# Patient Record
Sex: Male | Born: 1950 | Race: Black or African American | Hispanic: No | Marital: Single | Smoking: Never smoker
Health system: Southern US, Community
[De-identification: ages and names within clinical notes are randomized; demographics above are authoritative.]

## PROBLEM LIST (undated history)

## (undated) DIAGNOSIS — I10 Essential (primary) hypertension: Secondary | ICD-10-CM

## (undated) DIAGNOSIS — I454 Nonspecific intraventricular block: Secondary | ICD-10-CM

## (undated) DIAGNOSIS — C61 Malignant neoplasm of prostate: Secondary | ICD-10-CM

## (undated) DIAGNOSIS — R29898 Other symptoms and signs involving the musculoskeletal system: Secondary | ICD-10-CM

## (undated) DIAGNOSIS — G709 Myoneural disorder, unspecified: Secondary | ICD-10-CM

## (undated) HISTORY — PX: COLONOSCOPY: SHX174

## (undated) HISTORY — PX: BACK SURGERY: SHX140

## (undated) HISTORY — PX: PROSTATE BIOPSY: SHX241

## (undated) HISTORY — PX: HERNIA REPAIR: SHX51

## (undated) HISTORY — PX: ROTATOR CUFF REPAIR: SHX139

---

## 2000-02-11 ENCOUNTER — Encounter: Admission: RE | Admit: 2000-02-11 | Discharge: 2000-02-11 | Payer: Self-pay | Admitting: Internal Medicine

## 2000-03-24 ENCOUNTER — Encounter: Admission: RE | Admit: 2000-03-24 | Discharge: 2000-03-24 | Payer: Self-pay | Admitting: Internal Medicine

## 2000-07-03 ENCOUNTER — Encounter: Admission: RE | Admit: 2000-07-03 | Discharge: 2000-07-03 | Payer: Self-pay | Admitting: Internal Medicine

## 2000-07-07 ENCOUNTER — Encounter: Admission: RE | Admit: 2000-07-07 | Discharge: 2000-07-07 | Payer: Self-pay | Admitting: Internal Medicine

## 2000-11-10 ENCOUNTER — Encounter: Admission: RE | Admit: 2000-11-10 | Discharge: 2000-11-10 | Payer: Self-pay | Admitting: Internal Medicine

## 2000-12-01 ENCOUNTER — Ambulatory Visit (HOSPITAL_COMMUNITY): Admission: RE | Admit: 2000-12-01 | Discharge: 2000-12-01 | Payer: Self-pay | Admitting: Internal Medicine

## 2000-12-01 ENCOUNTER — Encounter: Payer: Self-pay | Admitting: Internal Medicine

## 2001-07-12 ENCOUNTER — Encounter: Admission: RE | Admit: 2001-07-12 | Discharge: 2001-07-12 | Payer: Self-pay | Admitting: Internal Medicine

## 2001-07-14 ENCOUNTER — Emergency Department (HOSPITAL_COMMUNITY): Admission: EM | Admit: 2001-07-14 | Discharge: 2001-07-14 | Payer: Self-pay | Admitting: Emergency Medicine

## 2001-09-26 ENCOUNTER — Encounter: Admission: RE | Admit: 2001-09-26 | Discharge: 2001-09-26 | Payer: Self-pay | Admitting: Internal Medicine

## 2002-08-20 ENCOUNTER — Encounter: Admission: RE | Admit: 2002-08-20 | Discharge: 2002-08-20 | Payer: Self-pay | Admitting: Internal Medicine

## 2003-04-18 ENCOUNTER — Encounter: Admission: RE | Admit: 2003-04-18 | Discharge: 2003-04-18 | Payer: Self-pay | Admitting: Internal Medicine

## 2003-04-25 ENCOUNTER — Encounter: Admission: RE | Admit: 2003-04-25 | Discharge: 2003-04-25 | Payer: Self-pay | Admitting: Internal Medicine

## 2003-05-13 ENCOUNTER — Encounter: Payer: Self-pay | Admitting: Internal Medicine

## 2003-05-13 ENCOUNTER — Ambulatory Visit (HOSPITAL_COMMUNITY): Admission: RE | Admit: 2003-05-13 | Discharge: 2003-05-13 | Payer: Self-pay | Admitting: Internal Medicine

## 2003-05-13 ENCOUNTER — Encounter: Admission: RE | Admit: 2003-05-13 | Discharge: 2003-05-13 | Payer: Self-pay | Admitting: Internal Medicine

## 2003-05-19 ENCOUNTER — Ambulatory Visit (HOSPITAL_COMMUNITY): Admission: RE | Admit: 2003-05-19 | Discharge: 2003-05-19 | Payer: Self-pay | Admitting: Internal Medicine

## 2003-05-19 ENCOUNTER — Encounter: Payer: Self-pay | Admitting: Internal Medicine

## 2003-05-22 ENCOUNTER — Encounter: Admission: RE | Admit: 2003-05-22 | Discharge: 2003-05-22 | Payer: Self-pay | Admitting: Internal Medicine

## 2003-05-30 ENCOUNTER — Encounter: Admission: RE | Admit: 2003-05-30 | Discharge: 2003-05-30 | Payer: Self-pay | Admitting: Internal Medicine

## 2003-06-13 ENCOUNTER — Encounter: Admission: RE | Admit: 2003-06-13 | Discharge: 2003-06-13 | Payer: Self-pay | Admitting: Internal Medicine

## 2003-06-27 ENCOUNTER — Encounter: Admission: RE | Admit: 2003-06-27 | Discharge: 2003-06-27 | Payer: Self-pay | Admitting: Internal Medicine

## 2004-01-30 ENCOUNTER — Encounter: Admission: RE | Admit: 2004-01-30 | Discharge: 2004-01-30 | Payer: Self-pay | Admitting: Internal Medicine

## 2005-01-25 ENCOUNTER — Emergency Department (HOSPITAL_COMMUNITY): Admission: EM | Admit: 2005-01-25 | Discharge: 2005-01-25 | Payer: Self-pay | Admitting: Family Medicine

## 2005-02-04 ENCOUNTER — Ambulatory Visit (HOSPITAL_COMMUNITY): Admission: RE | Admit: 2005-02-04 | Discharge: 2005-02-04 | Payer: Self-pay | Admitting: Internal Medicine

## 2005-02-04 ENCOUNTER — Ambulatory Visit: Payer: Self-pay | Admitting: Internal Medicine

## 2005-02-09 ENCOUNTER — Emergency Department (HOSPITAL_COMMUNITY): Admission: EM | Admit: 2005-02-09 | Discharge: 2005-02-09 | Payer: Self-pay | Admitting: Family Medicine

## 2005-02-25 ENCOUNTER — Ambulatory Visit: Payer: Self-pay | Admitting: Internal Medicine

## 2005-04-15 ENCOUNTER — Ambulatory Visit: Payer: Self-pay | Admitting: Internal Medicine

## 2005-06-17 ENCOUNTER — Encounter: Admission: RE | Admit: 2005-06-17 | Discharge: 2005-09-15 | Payer: Self-pay | Admitting: Family Medicine

## 2005-06-29 ENCOUNTER — Emergency Department (HOSPITAL_COMMUNITY): Admission: EM | Admit: 2005-06-29 | Discharge: 2005-06-30 | Payer: Self-pay | Admitting: Emergency Medicine

## 2006-01-27 ENCOUNTER — Emergency Department (HOSPITAL_COMMUNITY): Admission: EM | Admit: 2006-01-27 | Discharge: 2006-01-27 | Payer: Self-pay | Admitting: Emergency Medicine

## 2007-04-15 ENCOUNTER — Emergency Department (HOSPITAL_COMMUNITY): Admission: EM | Admit: 2007-04-15 | Discharge: 2007-04-15 | Payer: Self-pay | Admitting: Emergency Medicine

## 2007-04-17 ENCOUNTER — Emergency Department (HOSPITAL_COMMUNITY): Admission: EM | Admit: 2007-04-17 | Discharge: 2007-04-17 | Payer: Self-pay | Admitting: Emergency Medicine

## 2010-01-04 ENCOUNTER — Emergency Department (HOSPITAL_COMMUNITY): Admission: EM | Admit: 2010-01-04 | Discharge: 2010-01-04 | Payer: Self-pay | Admitting: Emergency Medicine

## 2010-07-03 ENCOUNTER — Emergency Department (HOSPITAL_COMMUNITY): Admission: EM | Admit: 2010-07-03 | Discharge: 2010-07-03 | Payer: Self-pay | Admitting: Emergency Medicine

## 2011-03-13 LAB — URINALYSIS, ROUTINE W REFLEX MICROSCOPIC
Bilirubin Urine: NEGATIVE
Ketones, ur: NEGATIVE mg/dL
Nitrite: NEGATIVE
Protein, ur: NEGATIVE mg/dL
pH: 6 (ref 5.0–8.0)

## 2011-04-13 ENCOUNTER — Emergency Department (HOSPITAL_COMMUNITY)
Admission: EM | Admit: 2011-04-13 | Discharge: 2011-04-13 | Disposition: A | Discharge status: Home / Self Care | Payer: Self-pay | Attending: Emergency Medicine | Admitting: Emergency Medicine

## 2011-04-13 DIAGNOSIS — I10 Essential (primary) hypertension: Secondary | ICD-10-CM | POA: Insufficient documentation

## 2011-04-13 DIAGNOSIS — R1909 Other intra-abdominal and pelvic swelling, mass and lump: Secondary | ICD-10-CM | POA: Insufficient documentation

## 2011-04-13 DIAGNOSIS — W57XXXA Bitten or stung by nonvenomous insect and other nonvenomous arthropods, initial encounter: Secondary | ICD-10-CM | POA: Insufficient documentation

## 2011-04-13 DIAGNOSIS — R109 Unspecified abdominal pain: Secondary | ICD-10-CM | POA: Insufficient documentation

## 2011-04-13 DIAGNOSIS — K4090 Unilateral inguinal hernia, without obstruction or gangrene, not specified as recurrent: Secondary | ICD-10-CM | POA: Insufficient documentation

## 2011-04-13 DIAGNOSIS — T148 Other injury of unspecified body region: Secondary | ICD-10-CM | POA: Insufficient documentation

## 2011-10-22 ENCOUNTER — Emergency Department (HOSPITAL_COMMUNITY)
Admission: EM | Admit: 2011-10-22 | Discharge: 2011-10-22 | Disposition: A | Discharge status: Home / Self Care | Payer: Medicare Other | Attending: Emergency Medicine | Admitting: Emergency Medicine

## 2011-10-22 DIAGNOSIS — I10 Essential (primary) hypertension: Secondary | ICD-10-CM | POA: Insufficient documentation

## 2011-10-22 DIAGNOSIS — K4090 Unilateral inguinal hernia, without obstruction or gangrene, not specified as recurrent: Secondary | ICD-10-CM | POA: Insufficient documentation

## 2012-11-14 ENCOUNTER — Other Ambulatory Visit: Payer: Self-pay | Admitting: Family Medicine

## 2012-11-14 DIAGNOSIS — R2230 Localized swelling, mass and lump, unspecified upper limb: Secondary | ICD-10-CM

## 2012-11-15 ENCOUNTER — Ambulatory Visit
Admission: RE | Admit: 2012-11-15 | Discharge: 2012-11-15 | Disposition: A | Discharge status: Home / Self Care | Payer: Medicare PPO | Source: Ambulatory Visit | Attending: Family Medicine | Admitting: Family Medicine

## 2012-11-15 DIAGNOSIS — R2230 Localized swelling, mass and lump, unspecified upper limb: Secondary | ICD-10-CM

## 2013-01-08 ENCOUNTER — Emergency Department (HOSPITAL_COMMUNITY): Payer: Medicare PPO

## 2013-01-08 ENCOUNTER — Emergency Department (HOSPITAL_COMMUNITY)
Admission: EM | Admit: 2013-01-08 | Discharge: 2013-01-08 | Disposition: A | Discharge status: Home / Self Care | Payer: Medicare PPO | Attending: Emergency Medicine | Admitting: Emergency Medicine

## 2013-01-08 ENCOUNTER — Encounter (HOSPITAL_COMMUNITY): Payer: Self-pay | Admitting: *Deleted

## 2013-01-08 DIAGNOSIS — R141 Gas pain: Secondary | ICD-10-CM | POA: Insufficient documentation

## 2013-01-08 DIAGNOSIS — R0789 Other chest pain: Secondary | ICD-10-CM | POA: Insufficient documentation

## 2013-01-08 DIAGNOSIS — R142 Eructation: Secondary | ICD-10-CM | POA: Insufficient documentation

## 2013-01-08 DIAGNOSIS — R110 Nausea: Secondary | ICD-10-CM | POA: Insufficient documentation

## 2013-01-08 DIAGNOSIS — I10 Essential (primary) hypertension: Secondary | ICD-10-CM | POA: Insufficient documentation

## 2013-01-08 HISTORY — DX: Essential (primary) hypertension: I10

## 2013-01-08 LAB — BASIC METABOLIC PANEL
BUN: 9 mg/dL (ref 6–23)
Calcium: 9.4 mg/dL (ref 8.4–10.5)
GFR calc Af Amer: 90 mL/min — ABNORMAL LOW (ref >90)
GFR calc non Af Amer: 77 mL/min — ABNORMAL LOW (ref >90)
Glucose, Bld: 93 mg/dL (ref 70–99)
Potassium: 4.5 mEq/L (ref 3.5–5.1)
Sodium: 137 mEq/L (ref 135–145)

## 2013-01-08 LAB — CBC
HCT: 46.8 % (ref 39.0–52.0)
Hemoglobin: 16.3 g/dL (ref 13.0–17.0)
MCH: 29.8 pg (ref 26.0–34.0)
MCHC: 34.8 g/dL (ref 30.0–36.0)
RDW: 13.5 % (ref 11.5–15.5)

## 2013-01-08 NOTE — ED Provider Notes (Signed)
History  This chart was scribed for Austin Lyons, MD by Shari Heritage, ED Scribe. The patient was seen in room APA06/APA06. Patient's care was started at 1446.  CSN: [id number]  Arrival date & time 01/08/13  1434   First MD Initiated Contact with Patient 01/08/13 1446      Chief Complaint  Patient presents with  . Chest Pain     The history is provided by the patient. No language interpreter was used.    HPI Comments: Austin Pena is a 62 y.o. male with history of hypertension who presents to the Emergency Department complaining of constant, dull, non-radiating, mild to moderate anterior chest pain onset 1 hour ago. There is associated nausea and belching. Patient says that he was "walking around" when pain started. Patient says that at initial onset, pain was severe. Pain is still present, but he says it is significantly improved. Pain is worse with palpation. Patient denies any other symptoms at this time. Patient is not taking any blood pressure medicines at this time. He does not smoke.   Past Medical History  Diagnosis Date  . Hypertension     Past Surgical History  Procedure Date  . Hernia repair     History reviewed. No pertinent family history.  History  Substance Use Topics  . Smoking status: Never Smoker   . Smokeless tobacco: Not on file  . Alcohol Use: No      Review of Systems  Constitutional: Negative for fever.  HENT: Negative for congestion.   Eyes: Negative for visual disturbance.  Respiratory: Negative for cough.   Cardiovascular: Positive for chest pain.  Gastrointestinal: Positive for nausea.  Genitourinary: Negative for dysuria and hematuria.  Musculoskeletal: Negative for back pain.  Skin: Negative for rash.  Neurological: Negative for headaches.  Psychiatric/Behavioral: Negative.     Allergies  Review of patient's allergies indicates no known allergies.  Home Medications  No current outpatient prescriptions on file.  Triage Vitals:  BP 170/106  Pulse 96  Temp 97.7 F (36.5 C) (Oral)  Resp 18  Ht 6' (1.829 m)  Wt 190 lb (86.183 kg)  BMI 25.77 kg/m2  SpO2 100%  Physical Exam  Constitutional: He is oriented to person, place, and time. He appears well-developed and well-nourished. No distress.  HENT:  Head: Normocephalic and atraumatic.  Mouth/Throat: Oropharynx is clear and moist.  Eyes: Conjunctivae normal are normal. Pupils are equal, round, and reactive to light.  Neck: Neck supple.  Cardiovascular: Normal rate and regular rhythm.   No murmur heard. Pulmonary/Chest: Effort normal and breath sounds normal. No respiratory distress. He has no wheezes. He has no rales. He exhibits tenderness.       Tenderness to palpation in anterior chest wall.   Abdominal: Soft. Bowel sounds are normal. He exhibits no distension. There is no tenderness.  Musculoskeletal: Normal range of motion. He exhibits no edema.  Neurological: He is alert and oriented to person, place, and time.  Skin: Skin is warm and dry. No rash noted.  Psychiatric: He has a normal mood and affect. His behavior is normal.    ED Course  Procedures (including critical care time) DIAGNOSTIC STUDIES: Oxygen Saturation is 100% on room air, normal by my interpretation.    COORDINATION OF CARE: 3:17 PM- Patient informed of current plan for treatment and evaluation and agrees with plan at this time.   Labs Reviewed - No data to display  Dg Chest Portable 1 View  01/08/2013  *RADIOLOGY REPORT*  Clinical  Data: Chest pain  PORTABLE CHEST - 1 VIEW  Comparison: None.  Findings: Lungs are essentially clear.  Mild linear scarring versus atelectasis at the left lung base.  No pleural effusion or pneumothorax.  Cardiomediastinal silhouette is within normal limits.  IMPRESSION: No evidence of acute cardiopulmonary disease.   Original Report Authenticated By: Charline Bills, M.D.      No diagnosis found.   Date: 01/08/2013  Rate: 96  Rhythm: normal sinus  rhythm  QRS Axis: normal  Intervals: normal  ST/T Wave abnormalities: nonspecific T wave changes  Conduction Disutrbances:right bundle branch block  Narrative Interpretation:   Old EKG Reviewed: changes noted    MDM  The patient presents here with symptoms that are atypical for cardiac pain.  His ekg shows a RBBB that is new when compared with 07/14/2001.  There was a non-specific ivcd on this previous ekg.  The initial and follow up troponin are negative and he is feeling better.  I have discussed admission versus discharge with the patient.  He prefers not to stay and will follow up if his symptoms return.  I doubt this is cardiac.  There is no hypoxia and nothing on the chest xray to suggest a pulmonary cause.       I personally performed the services described in this documentation, which was scribed in my presence. The recorded information has been reviewed and is accurate.      Austin Lyons, MD 01/08/13 2159

## 2013-01-08 NOTE — ED Notes (Signed)
Chest pain , onset 25 min pta

## 2013-12-02 ENCOUNTER — Other Ambulatory Visit: Payer: Self-pay | Admitting: Family Medicine

## 2013-12-02 DIAGNOSIS — D492 Neoplasm of unspecified behavior of bone, soft tissue, and skin: Secondary | ICD-10-CM

## 2013-12-03 ENCOUNTER — Ambulatory Visit: Payer: Medicare PPO | Attending: Orthopedic Surgery | Admitting: Physical Therapy

## 2013-12-03 DIAGNOSIS — M2550 Pain in unspecified joint: Secondary | ICD-10-CM | POA: Insufficient documentation

## 2013-12-03 DIAGNOSIS — IMO0001 Reserved for inherently not codable concepts without codable children: Secondary | ICD-10-CM | POA: Insufficient documentation

## 2013-12-03 DIAGNOSIS — M25619 Stiffness of unspecified shoulder, not elsewhere classified: Secondary | ICD-10-CM | POA: Insufficient documentation

## 2013-12-03 DIAGNOSIS — R293 Abnormal posture: Secondary | ICD-10-CM | POA: Insufficient documentation

## 2013-12-03 DIAGNOSIS — M25519 Pain in unspecified shoulder: Secondary | ICD-10-CM | POA: Insufficient documentation

## 2013-12-04 ENCOUNTER — Ambulatory Visit: Payer: Medicare PPO | Admitting: Physical Therapy

## 2013-12-09 ENCOUNTER — Encounter: Payer: Medicare PPO | Admitting: Physical Therapy

## 2013-12-11 ENCOUNTER — Ambulatory Visit: Payer: Medicare PPO | Admitting: Rehabilitation

## 2013-12-12 ENCOUNTER — Ambulatory Visit
Admission: RE | Admit: 2013-12-12 | Discharge: 2013-12-12 | Disposition: A | Discharge status: Home / Self Care | Payer: Medicare PPO | Source: Ambulatory Visit | Attending: Family Medicine | Admitting: Family Medicine

## 2013-12-12 DIAGNOSIS — D492 Neoplasm of unspecified behavior of bone, soft tissue, and skin: Secondary | ICD-10-CM

## 2013-12-12 MED ORDER — GADOBENATE DIMEGLUMINE 529 MG/ML IV SOLN
18.0000 mL | Freq: Once | INTRAVENOUS | Status: AC | PRN
  Administered 2013-12-12: 18 mL via INTRAVENOUS

## 2013-12-16 ENCOUNTER — Ambulatory Visit: Payer: Medicare PPO | Admitting: Rehabilitation

## 2013-12-18 ENCOUNTER — Ambulatory Visit: Payer: Medicare PPO | Admitting: Physical Therapy

## 2013-12-24 ENCOUNTER — Ambulatory Visit: Payer: Medicare PPO | Admitting: Rehabilitation

## 2013-12-25 ENCOUNTER — Ambulatory Visit: Payer: Medicare PPO | Admitting: Physical Therapy

## 2013-12-31 ENCOUNTER — Ambulatory Visit: Payer: Medicare PPO | Attending: Orthopedic Surgery | Admitting: Physical Therapy

## 2013-12-31 DIAGNOSIS — R293 Abnormal posture: Secondary | ICD-10-CM | POA: Insufficient documentation

## 2013-12-31 DIAGNOSIS — IMO0001 Reserved for inherently not codable concepts without codable children: Secondary | ICD-10-CM | POA: Insufficient documentation

## 2013-12-31 DIAGNOSIS — M2550 Pain in unspecified joint: Secondary | ICD-10-CM | POA: Insufficient documentation

## 2013-12-31 DIAGNOSIS — M25619 Stiffness of unspecified shoulder, not elsewhere classified: Secondary | ICD-10-CM | POA: Insufficient documentation

## 2013-12-31 DIAGNOSIS — M25519 Pain in unspecified shoulder: Secondary | ICD-10-CM | POA: Insufficient documentation

## 2014-01-02 ENCOUNTER — Ambulatory Visit: Payer: Medicare PPO | Admitting: Rehabilitation

## 2014-01-08 ENCOUNTER — Ambulatory Visit: Payer: Medicare PPO | Admitting: Physical Therapy

## 2014-01-09 ENCOUNTER — Ambulatory Visit: Payer: Medicare PPO | Admitting: Rehabilitation

## 2014-01-14 ENCOUNTER — Ambulatory Visit: Payer: Medicare PPO | Admitting: Physical Therapy

## 2014-01-15 ENCOUNTER — Ambulatory Visit: Payer: Medicare PPO | Admitting: Rehabilitation

## 2014-01-22 ENCOUNTER — Ambulatory Visit: Payer: Medicare PPO | Admitting: Physical Therapy

## 2014-01-27 ENCOUNTER — Ambulatory Visit: Payer: Medicare PPO | Attending: Orthopedic Surgery | Admitting: Rehabilitation

## 2014-01-27 DIAGNOSIS — IMO0001 Reserved for inherently not codable concepts without codable children: Secondary | ICD-10-CM | POA: Insufficient documentation

## 2014-01-27 DIAGNOSIS — M2550 Pain in unspecified joint: Secondary | ICD-10-CM | POA: Insufficient documentation

## 2014-01-27 DIAGNOSIS — M25519 Pain in unspecified shoulder: Secondary | ICD-10-CM | POA: Insufficient documentation

## 2014-01-27 DIAGNOSIS — R293 Abnormal posture: Secondary | ICD-10-CM | POA: Insufficient documentation

## 2014-01-27 DIAGNOSIS — M25619 Stiffness of unspecified shoulder, not elsewhere classified: Secondary | ICD-10-CM | POA: Insufficient documentation

## 2014-01-29 ENCOUNTER — Ambulatory Visit: Payer: Medicare PPO | Admitting: Rehabilitation

## 2014-02-03 ENCOUNTER — Ambulatory Visit: Payer: Medicare PPO | Admitting: Rehabilitation

## 2014-02-06 ENCOUNTER — Ambulatory Visit: Payer: Medicare PPO | Admitting: Physical Therapy

## 2014-02-11 ENCOUNTER — Encounter: Payer: Medicare PPO | Admitting: Physical Therapy

## 2014-02-13 ENCOUNTER — Encounter: Payer: Medicare PPO | Admitting: Physical Therapy

## 2014-02-18 ENCOUNTER — Ambulatory Visit: Payer: Medicare PPO | Admitting: Physical Therapy

## 2014-02-24 ENCOUNTER — Ambulatory Visit: Payer: Medicare PPO | Attending: Orthopedic Surgery

## 2014-02-24 DIAGNOSIS — IMO0001 Reserved for inherently not codable concepts without codable children: Secondary | ICD-10-CM | POA: Insufficient documentation

## 2014-02-24 DIAGNOSIS — R293 Abnormal posture: Secondary | ICD-10-CM | POA: Insufficient documentation

## 2014-02-24 DIAGNOSIS — M2550 Pain in unspecified joint: Secondary | ICD-10-CM | POA: Insufficient documentation

## 2014-02-24 DIAGNOSIS — M25619 Stiffness of unspecified shoulder, not elsewhere classified: Secondary | ICD-10-CM | POA: Insufficient documentation

## 2014-02-24 DIAGNOSIS — M25519 Pain in unspecified shoulder: Secondary | ICD-10-CM | POA: Insufficient documentation

## 2015-05-21 ENCOUNTER — Encounter (HOSPITAL_COMMUNITY): Payer: Self-pay | Admitting: *Deleted

## 2015-05-21 ENCOUNTER — Emergency Department (INDEPENDENT_AMBULATORY_CARE_PROVIDER_SITE_OTHER)
Admission: EM | Admit: 2015-05-21 | Discharge: 2015-05-21 | Disposition: A | Discharge status: Home / Self Care | Payer: Medicare PPO | Source: Home / Self Care | Attending: Family Medicine | Admitting: Family Medicine

## 2015-05-21 DIAGNOSIS — K05219 Aggressive periodontitis, localized, unspecified severity: Secondary | ICD-10-CM

## 2015-05-21 DIAGNOSIS — K0520 Aggressive periodontitis, unspecified: Secondary | ICD-10-CM | POA: Diagnosis not present

## 2015-05-21 MED ORDER — AMOXICILLIN 500 MG PO CAPS: 1000.0000 mg | ORAL_CAPSULE | Freq: Two times a day (BID) | ORAL | Status: DC

## 2015-05-21 NOTE — ED Provider Notes (Signed)
CSN: [id number]     Arrival date & time 05/21/15  1303 History   First MD Initiated Contact with Patient 05/21/15 1335     Chief Complaint  Patient presents with  . Dental Problem   (Consider location/radiation/quality/duration/timing/severity/associated sxs/prior Treatment) HPI Comments: 64 year old male complaining of what he believes to be an abscess on his  gum for one week. He points to the base of the left lower incisor tooth. He states it is mildly painful but there is a growing lesion within the gingiva. He denies fever. Denies toothache. Denies swelling or pain within the soft tissues of the face or lip.    Past Medical History  Diagnosis Date  . Hypertension    Past Surgical History  Procedure Laterality Date  . Hernia repair     History reviewed. No pertinent family history. History  Substance Use Topics  . Smoking status: Never Smoker   . Smokeless tobacco: Not on file  . Alcohol Use: No    Review of Systems  Constitutional: Negative.   HENT: Negative for congestion, ear discharge, postnasal drip, rhinorrhea, sore throat, tinnitus and trouble swallowing.   Respiratory: Negative for shortness of breath.   Cardiovascular: Negative for chest pain.  Skin: Negative for rash and wound.  Psychiatric/Behavioral: Negative.   All other systems reviewed and are negative.   Allergies  Review of patient's allergies indicates no known allergies.  Home Medications   Prior to Admission medications   Medication Sig Start Date End Date Taking? Authorizing Provider  amitriptyline (ELAVIL) 150 MG tablet Take 150 mg by mouth at bedtime.    Historical Provider, MD  amoxicillin (AMOXIL) 500 MG capsule Take 2 capsules (1,000 mg total) by mouth 2 (two) times daily. 05/21/15   Janne Napoleon, NP  ibuprofen (ADVIL,MOTRIN) 200 MG tablet Take 200 mg by mouth every 6 (six) hours as needed. Pain    Historical Provider, MD   BP 136/88 mmHg  Pulse 93  Temp(Src) 98 F (36.7 C) (Oral)  Resp  16  SpO2 98% Physical Exam  Constitutional: He is oriented to person, place, and time. He appears well-developed and well-nourished. No distress.  HENT:  Nose: Nose normal.  Mouth/Throat: Oropharynx is clear and moist. No oropharyngeal exudate.  Patient is primarily edentulous. There are only 4 teeth on the bottom. The left lower canine tooth is nontender. The gingiva adjacent to that tooth extending inferiorly contains 2 cystic type lesions one is pearl colored and spherical dissected immediately below that one is erythematous and spherical. Surrounding gingiva is erythematous and hypertrophic.  Neck: Normal range of motion. Neck supple.  Cardiovascular: Normal rate and regular rhythm.   Pulmonary/Chest: Effort normal and breath sounds normal.  Lymphadenopathy:    He has no cervical adenopathy.  Neurological: He is alert and oriented to person, place, and time. He exhibits normal muscle tone.  Skin: Skin is dry.  Nursing note and vitals reviewed.   ED Course  Procedures (including critical care time) Labs Review Labs Reviewed - No data to display  Imaging Review No results found.   MDM   1. Gingival abscess    This could possibly be cystic lesions which have become infected. So far other is no facial involvement. No dental tenderness. He only has approximate 4 teeth in his mouth. Most of them are rotting. Treatment amoxicillin 10 day course. Follow-up with dentist as soon as possible. For any worsening as stated above seek medical attention promptly.    Janne Napoleon, NP 05/21/15 1355

## 2015-05-21 NOTE — ED Notes (Signed)
Pt  Reports  Symptoms    Of  Dental  Pain   With   Swelling      Of  His  Gum     With onset  Of  About  1  Month       -  Pt    Is  Sitting  Upright on  The  Exam table  Speaking  In  Complete    sentances

## 2015-05-21 NOTE — Discharge Instructions (Signed)
Gingivitis Gingivitis is a form of gum (periodontal) disease that causes redness, soreness, and swelling (inflammation) of your gums. CAUSES The most common cause of gingivitis is poor oral hygiene. A sticky substance made of bacteria, mucus, and food particles (plaque), is deposited on the exposed part of teeth. As plaque builds up, it reacts with the saliva in your mouth to form something called  tartar. Tartar is a hard deposit that becomes trapped around the base of the tooth. Plaque and tartar irritate the gums, leading to the formation of gingivitis. Other factors that increase your risk for gingivitis include:   Tobacco use.  Diabetes.  Older age.  Certain medications.  Certain viral or fungal infections.  Dry mouth.  Hormonal changes such as during pregnancy.  Poor nutrition.  Substance abuse.  Poor fitting dental restorations or appliances. SYMPTOMS You may notice inflammation of the soft tissue (gingiva) around the teeth. When these tissues become inflamed, they bleed easily, especially during flossing or brushing. The gums may also be:   Tender to the touch.  Bright red, purple red, or have a shiny appearance.  Swollen.  Wearing away from the teeth (receding), which exposes more of the tooth. Bad breath is often present. Continued infection around teeth can eventually cause cavities and loosen teeth. This may lead to eventual tooth loss. DIAGNOSIS A medical and dental history will be taken. Your mouth, teeth, and gums will be examined. Your dentist will look for soft, swollen purple-red, irritated gums. There may be deposits of plaque and tartar at the base of the teeth. Your gums will be looked at for the degree of redness, puffiness, and bleeding tendencies. Your dentist will see if any of the teeth are loose. X-rays may be taken to see if the inflammation has spread to the supporting structures of the teeth. TREATMENT The goal is to reduce and reverse the  inflammation. Proper treatment can usually reverse the symptoms of gingivitis and prevent further progression of the disease. Have your teeth cleaned. During the cleaning, all plaque and tartar will be removed. Instruction for proper home care will be given. You will need regular professional cleanings and check-ups in the future. HOME CARE INSTRUCTIONS  Brush your teeth twice a day and floss at least once per day. When flossing, it is best to floss first then brush.  Limit sugar between meals and maintain a well-balanced diet.  Even the best dental hygiene will not prevent plaque from developing. It is necessary for you to see your dentist on a regular basis for cleaning and regular checkups.  Your dentist can recommend proper oral hygiene and mouth care and suggest special toothpastes or mouth rinses.  Stop smoking. SEEK DENTAL OR MEDICAL CARE IF:  You have painful, reddened tissue around your teeth, or you have puffy swollen gums.  You have difficulty chewing.  You notice any loose or infected teeth.  You have swollen glands.  Your gums bleed easily when you brush your teeth or are very tender to the touch. Document Released: 06/07/2001 Document Revised: 03/05/2012 Document Reviewed: 03/18/2011 [facility] Patient Information 2015 [place], [place]. This information is not intended to replace advice given to you by your health care provider. Make sure you discuss any questions you have with your health care provider.  Abscessed Tooth An abscessed tooth is an infection around your tooth. It may be caused by holes or damage to the tooth (cavity) or a dental disease. An abscessed tooth causes mild to very bad pain in and  around the tooth. See your dentist right away if you have tooth or gum pain. HOME CARE  Take your medicine as told. Finish it even if you start to feel better.  Do not drive after taking pain medicine.  Rinse your mouth (gargle) often with salt water ( teaspoon salt in  8 ounces of warm water).  Do not apply heat to the outside of your face. GET HELP RIGHT AWAY IF:   You have a temperature by mouth above 102 F (38.9 C), not controlled by medicine.  You have chills and a very bad headache.  You have problems breathing or swallowing.  Your mouth will not open.  You develop puffiness (swelling) on the neck or around the eye.  Your pain is not helped by medicine.  Your pain is getting worse instead of better. MAKE SURE YOU:   Understand these instructions.  Will watch your condition.  Will get help right away if you are not doing well or get worse. Document Released: 05/30/2008 Document Revised: 03/05/2012 Document Reviewed: 03/22/2011 [facility] Patient Information 2015 [place], [place]. This information is not intended to replace advice given to you by your health care provider. Make sure you discuss any questions you have with your health care provider.  Periodontal Disease Periodontal disease, or gum disease, is a type of oral disease that affects the surrounding and supporting tissues of the teeth. These include the gums (gingivae), ligaments, and tooth socket (alveolar bone). Periodontal disease can affect one tooth or many teeth. If left untreated, it may lead to tooth loss.  CAUSES The main cause of periodontal disease is dental plaque, which contains harmful bacteria. These bacteria can cause the gums to become inflamed and infected. Further progression of the disease can damage the other supporting tissues.  RISK FACTORS  Diabetes.   Smoking and tobacco use.   Genetics.   Hormonal changes of puberty, menopause, and pregnancy.   Stress.   Clenching or grinding your teeth.   Substance abuse.  Poor nutrition.   Diseases that interfere with the body's immune system.   Certain medicines. SIGNS AND SYMPTOMS  Red or swollen gums.  Bad breath that does not go away.  Gums that have pulled away from the teeth.  Gums  that bleed easily.  Permanent teeth that are loose or separating.  Pain when chewing.  Changes in the way your teeth fit together.  Sensitive teeth. DIAGNOSIS  A thorough examination of the periodontal tissues will be done by your dentist. X-rays may be needed. Evaluation of your medical history will be needed to see if there are other factors or underlying conditions that may contribute to the disease. TREATMENT The number and types of treatment will vary depending on the extent of the disease. Treatment may include brushing and flossing only. Further disease progression may necessitate scaling and root planing or even surgery. The main goal is to control the infection. Good oral hygiene at home is necessary for the success of all types of treatment. HOME CARE INSTRUCTIONS   Practice good oral hygiene. This includes flossing and brushing your teeth every day.   See your dentist regularly, at least 2 times per year.   Stop smoking if you smoke.  Eat a well-balanced diet. SEEK IMMEDIATE DENTAL CARE IF:   You have any signs or symptoms of periodontal disease along with:  Swelling of your face, neck, or jaw.  Inability to open your mouth.  Severe pain uncontrolled by pain medicine.  You have a  fever or persistent symptoms for more than 2-3 days.  You have a fever and your symptoms suddenly get worse. Document Released: 12/15/2003 Document Revised: 08/14/2013 Document Reviewed: 05/21/2013 [facility] Patient Information 2015 [place], [place]. This information is not intended to replace advice given to you by your health care provider. Make sure you discuss any questions you have with your health care provider.

## 2015-07-24 DIAGNOSIS — G4700 Insomnia, unspecified: Secondary | ICD-10-CM | POA: Diagnosis not present

## 2015-07-24 DIAGNOSIS — G8929 Other chronic pain: Secondary | ICD-10-CM | POA: Diagnosis not present

## 2015-07-24 DIAGNOSIS — Z136 Encounter for screening for cardiovascular disorders: Secondary | ICD-10-CM | POA: Diagnosis not present

## 2015-07-24 DIAGNOSIS — Z125 Encounter for screening for malignant neoplasm of prostate: Secondary | ICD-10-CM | POA: Diagnosis not present

## 2015-07-24 DIAGNOSIS — M150 Primary generalized (osteo)arthritis: Secondary | ICD-10-CM | POA: Diagnosis not present

## 2015-07-24 DIAGNOSIS — I10 Essential (primary) hypertension: Secondary | ICD-10-CM | POA: Diagnosis not present

## 2015-09-07 DIAGNOSIS — R350 Frequency of micturition: Secondary | ICD-10-CM | POA: Diagnosis not present

## 2015-09-07 DIAGNOSIS — R972 Elevated prostate specific antigen [PSA]: Secondary | ICD-10-CM | POA: Diagnosis not present

## 2015-10-08 DIAGNOSIS — R972 Elevated prostate specific antigen [PSA]: Secondary | ICD-10-CM | POA: Diagnosis not present

## 2015-10-16 DIAGNOSIS — R350 Frequency of micturition: Secondary | ICD-10-CM | POA: Diagnosis not present

## 2015-10-16 DIAGNOSIS — R972 Elevated prostate specific antigen [PSA]: Secondary | ICD-10-CM | POA: Diagnosis not present

## 2015-11-13 DIAGNOSIS — R972 Elevated prostate specific antigen [PSA]: Secondary | ICD-10-CM | POA: Diagnosis not present

## 2015-11-23 DIAGNOSIS — R972 Elevated prostate specific antigen [PSA]: Secondary | ICD-10-CM | POA: Diagnosis not present

## 2015-12-24 DIAGNOSIS — C61 Malignant neoplasm of prostate: Secondary | ICD-10-CM | POA: Diagnosis not present

## 2015-12-24 DIAGNOSIS — R972 Elevated prostate specific antigen [PSA]: Secondary | ICD-10-CM | POA: Diagnosis not present

## 2016-01-04 DIAGNOSIS — C61 Malignant neoplasm of prostate: Secondary | ICD-10-CM | POA: Diagnosis not present

## 2016-01-04 DIAGNOSIS — Z0000 Encounter for general adult medical examination without abnormal findings: Secondary | ICD-10-CM | POA: Diagnosis not present

## 2016-01-15 DIAGNOSIS — K090 Developmental odontogenic cysts: Secondary | ICD-10-CM | POA: Diagnosis not present

## 2016-01-25 DIAGNOSIS — G4700 Insomnia, unspecified: Secondary | ICD-10-CM | POA: Diagnosis not present

## 2016-01-25 DIAGNOSIS — K090 Developmental odontogenic cysts: Secondary | ICD-10-CM | POA: Diagnosis not present

## 2016-01-25 DIAGNOSIS — G8929 Other chronic pain: Secondary | ICD-10-CM | POA: Diagnosis not present

## 2016-01-25 DIAGNOSIS — Z23 Encounter for immunization: Secondary | ICD-10-CM | POA: Diagnosis not present

## 2016-01-25 DIAGNOSIS — M150 Primary generalized (osteo)arthritis: Secondary | ICD-10-CM | POA: Diagnosis not present

## 2016-01-25 DIAGNOSIS — I10 Essential (primary) hypertension: Secondary | ICD-10-CM | POA: Diagnosis not present

## 2016-02-16 DIAGNOSIS — C61 Malignant neoplasm of prostate: Secondary | ICD-10-CM | POA: Diagnosis not present

## 2016-02-25 ENCOUNTER — Encounter: Payer: Self-pay | Admitting: Medical Oncology

## 2016-02-25 ENCOUNTER — Telehealth: Payer: Self-pay | Admitting: Medical Oncology

## 2016-02-25 NOTE — Telephone Encounter (Signed)
Oncology Nurse Navigator Documentation  Oncology Nurse Navigator Flowsheets 02/25/2016  Navigator Encounter Type Telephone- Left a message requesting a return call to discuss his referral to the Prostate [place].  Telephone Outgoing Call

## 2016-03-02 ENCOUNTER — Encounter: Payer: Self-pay | Admitting: Radiation Oncology

## 2016-03-02 NOTE — Progress Notes (Signed)
GU Location of Tumor / Histology: prostatic adenocarcinoma  If Prostate Cancer, Gleason Score is (3 + 4) and PSA is (14.11) November 2016  Flonnie Overman presented with an elevated PSA of 16.28 June 2015.  Biopsies of prostate (if applicable) revealed:    Past/Anticipated interventions by urology, if any: antibiotics, biopsy,referral to [facility] to discuss surgery with Alinda Money and radiation with Tammi Klippel  Past/Anticipated interventions by medical oncology, if any: no  Weight changes, if any: no  Bowel/Bladder complaints, if any: IPSS 14 noted in record from [facility]   Nausea/Vomiting, if any: no  Pain issues, if any:  no  SAFETY ISSUES:  Prior radiation? no  Pacemaker/ICD? no  Possible current pregnancy? no  Is the patient on methotrexate? no  Current Complaints / other details:  65 year old male. Prostate volume: 52 cc. [id number]. No family hx of cancer noted in chart. Reports he is on disability and single. Colonoscopy in 2013.

## 2016-03-03 ENCOUNTER — Telehealth: Payer: Self-pay | Admitting: Medical Oncology

## 2016-03-03 ENCOUNTER — Encounter: Payer: Self-pay | Admitting: Medical Oncology

## 2016-03-03 NOTE — Telephone Encounter (Signed)
Oncology Nurse Navigator Documentation  Oncology Nurse Navigator Flowsheets 03/01/2016 03/02/2016 03/03/2016  Navigator Location - - -  Navigator Encounter Type Telephone Telephone Telephone- Left a message requesting a return call to confirm his appointment for Prostate [facility].  Telephone Incoming Call Outgoing Call;Appt Confirmation/Clarification Outgoing Call;Appt Confirmation/Clarification  Abnormal Finding Date - - -  Confirmed Diagnosis Date - - -

## 2016-03-03 NOTE — Telephone Encounter (Signed)
Oncology Nurse Navigator Documentation  Oncology Nurse Navigator Flowsheets 03/01/2016 03/01/2016 03/02/2016  Navigator Location - - -  Navigator Encounter Type Telephone Telephone Telephone- requested a return call to discuss his referral to Prostate [place].  Telephone Outgoing Call Incoming Call Outgoing Call;Appt Confirmation/Clarification  Abnormal Finding Date - - -  Confirmed Diagnosis Date - - -

## 2016-03-03 NOTE — Telephone Encounter (Signed)
Oncology Nurse Navigator Documentation  Oncology Nurse Navigator Flowsheets 02/25/2016 03/01/2016 03/01/2016  Navigator Location [facility] - -  Navigator Encounter Type Telephone Telephone Telephone  Telephone Outgoing Call Outgoing Call Incoming Call- Austin Pena left a message stating that he though his appointment was today and not Friday. I called him back and left a message with date and time. The packet I mailed him has the date and time included. I asked him to call me so we could discuss further.  Abnormal Finding Date 09/07/2015 - -  Confirmed Diagnosis Date 12/24/2015 - -

## 2016-03-03 NOTE — Telephone Encounter (Signed)
Oncology Nurse Navigator Documentation  Oncology Nurse Navigator Flowsheets 02/25/2016 03/01/2016  Navigator Location [facility] -  Navigator Encounter Type Telephone Telephone- Left a message requesting a return call regarding his appointment for Prostate [place]  Telephone Outgoing Call [place] Call  Abnormal Finding Date 09/07/2015 -  Confirmed Diagnosis Date 12/24/2015 -

## 2016-03-04 ENCOUNTER — Ambulatory Visit
Admission: RE | Admit: 2016-03-04 | Discharge: 2016-03-04 | Disposition: A | Discharge status: Home / Self Care | Payer: Medicare PPO | Source: Ambulatory Visit | Attending: Radiation Oncology | Admitting: Radiation Oncology

## 2016-03-04 ENCOUNTER — Encounter: Payer: Self-pay | Admitting: General Practice

## 2016-03-04 ENCOUNTER — Encounter: Payer: Self-pay | Admitting: Radiation Oncology

## 2016-03-04 ENCOUNTER — Encounter: Payer: Self-pay | Admitting: Medical Oncology

## 2016-03-04 ENCOUNTER — Ambulatory Visit (HOSPITAL_BASED_OUTPATIENT_CLINIC_OR_DEPARTMENT_OTHER): Payer: Medicare PPO | Admitting: Oncology

## 2016-03-04 VITALS — BP 146/81 | HR 97 | Resp 16 | Ht 72.0 in | Wt 204.4 lb

## 2016-03-04 DIAGNOSIS — C61 Malignant neoplasm of prostate: Secondary | ICD-10-CM

## 2016-03-04 HISTORY — DX: Malignant neoplasm of prostate: C61

## 2016-03-04 NOTE — Progress Notes (Signed)
[facility] Psychosocial Distress Screening Spiritual Care  Shadowed by Counseling Intern Wendall Papa, met with Mr Venezia at [facility] to introduce [facility] team/resources, reviewing distress screen per protocol.  The patient scored a 3 on the Psychosocial Distress Thermometer which indicates mild distress. Also assessed for distress and other psychosocial needs.   ONCBCN DISTRESS SCREENING 03/04/2016  Screening Type Initial Screening  Distress experienced in past week (1-10) 3  Referral to support programs Yes  Other Support Team, incl Spiritual Care, Counseling Interns   Mr Degeorge shared about his coping with chronic pain for 25 years, noting that his techniques that center him in the present moment are also helpful in coping with the distress of a ca dx.  Per pt, he uses deep breathing, prayer and Bible reading, self-help and reflection books, and working with his hands (making stained glass, disassembly/reassembly of lawnmowers for repair, hx welding, etc) as tools for practicing mindfulness.  We talked about how the [place] offers support programming for similar reasons.  Follow up needed: No.  Pt aware of ongoing [facility] team/programming availability and has contact info in resource packet, which reviewed.  Please also page as needs arise/circumstances change.  Thank you.  [place], [place], [facility] Pager [phone] Voicemail  [phone]

## 2016-03-04 NOTE — Progress Notes (Signed)
                               Care Plan Summary  Name: [name] DOB: [birth year]   Your Medical Team:   Urologist -  Dr. Raynelle Bring, [facility]  Radiation Oncologist - Dr. Tyler Pita, [facility]   Medical Oncologist - Dr. Zola Button, [place]  Recommendations: 1) Robotic Prostatectomy  2) Radiation with Androgen Deprivation (6 months)  * These recommendations are based on information available as of today's consult.      Recommendations may change depending on the results of further tests or exams.  Next Steps: 1) Think about and consider the options presented today 2) Call Cira Rue, RN with decision.  When appointments need to be scheduled, you will be contacted by [facility] and/or [facility].  Questions?  Please do not hesitate to call Cira Rue, RN, BSN, CRNI at [phone] any questions or concerns.  Shirlean Mylar is your Oncology Nurse Navigator and is available to assist you while you're receiving your medical care at [facility].

## 2016-03-04 NOTE — Progress Notes (Signed)
Reason for Referral: Prostate cancer.   HPI: 65 year old gentleman currently of [place] where he lived the majority of his life. She gentleman with history of hypertension and neuropathy who was in his usual state of health until he presented with an elevated PSA by his primary care provider. His PSA was up to 14.1 in November 2016. Previously have been up to 16 in July 2016. He did have some lower urinary tract symptoms including some nocturia and frequency. He subsequently underwent a biopsy done on 12/24/2015. He has a Gleason score 3+4 = 7 and 3 cores and a Gleason score of 6 and 3 other cores. Otherwise he is asymptomatic from his disease. He denied any headaches, blurry vision, syncope or seizures. He has not reported any fevers, chills or sweats. Does not report any cough, wheezing or hemoptysis. Does not report any nausea, vomiting or abdominal pain. Does not report any frequency, urgency or hesitancy. Does report some nocturia but no hematuria. Does not report any skeletal complaints of arthralgias myalgias. Remaining review of systems unremarkable.   Past Medical History  Diagnosis Date  . Hypertension   . Prostate cancer [facility])   :  Past Surgical History  Procedure Laterality Date  . Hernia repair    . Back surgery    . Rotator cuff repair    :   Current outpatient prescriptions:  .  amitriptyline (ELAVIL) 150 MG tablet, Take 150 mg by mouth at bedtime., Disp: , Rfl:  .  amoxicillin (AMOXIL) 500 MG capsule, Take 2 capsules (1,000 mg total) by mouth 2 (two) times daily., Disp: 40 capsule, Rfl: 0 .  gabapentin (NEURONTIN) 100 MG capsule, Take 100 mg by mouth 3 (three) times daily., Disp: , Rfl:  .  HYDROcodone-acetaminophen (NORCO/VICODIN) 5-325 MG tablet, Take 1 tablet by mouth every 6 (six) hours as needed for moderate pain., Disp: , Rfl:  .  ibuprofen (ADVIL,MOTRIN) 200 MG tablet, Take 200 mg by mouth every 6 (six) hours as needed. Pain, Disp: , Rfl:  .  lisinopril  (PRINIVIL,ZESTRIL) 10 MG tablet, Take 10 mg by mouth daily., Disp: , Rfl: :  Allergies  Allergen Reactions  . Aspirin   :  Family History  Problem Relation Age of Onset  . Cancer Neg Hx   :  Social History   Social History  . Marital Status: Single    Spouse Name: N/A  . Number of Children: N/A  . Years of Education: N/A   Occupational History  . Not on file.   Social History Main Topics  . Smoking status: Never Smoker   . Smokeless tobacco: Never Used  . Alcohol Use: No  . Drug Use: No  . Sexual Activity: Yes   Other Topics Concern  . Not on file   Social History Narrative  :  Pertinent items are noted in HPI.  Exam: ECOG 1 General appearance: alert and cooperative. Without distress. Head: Normocephalic, without obvious abnormality Throat: lips, mucosa, and tongue normal; teeth and gums normal no oral thrush noted. Neck: no adenopathy Back: negative Resp: clear to auscultation bilaterally Chest wall: no tenderness Cardio: regular rate and rhythm, S1, S2 normal, no murmur, click, rub or gallop GI: soft, non-tender; bowel sounds normal; no masses,  no organomegaly Extremities: extremities normal, atraumatic, no cyanosis or edema Skin: Skin color, texture, turgor normal. No rashes or lesions Lymph nodes: Cervical, supraclavicular, and axillary nodes normal.    Assessment and Plan:   65 year old gentleman with a diagnosis of prostate cancer in  December 2016. His PSA 14.11 and a Gleason score of 3+4 = 7 in 3 cores and Gleason 6 and 3 other cores. His case was discussed today and the prostate cancer multidisciplinary clinic. His pathology specimen was discussed with the reviewing pathologist.  Options of therapy were reviewed today including radiation therapy with short course of androgen deprivation versus radical prostatectomy. There is no role for systemic chemotherapy or immunotherapy in this particular setting. I have recommended adding a short course of  androgen deprivation for 6 months if he chooses to proceed with radiation therapy.  The, case associated with androgen deprivation was reviewed today. These complications would include weight gain, hot flashes, erectile dysfunction and possible metabolic syndrome. There is a low risk for GI toxicity or cardiac complications.  Airway is options and make a decision regarding treatment options in the near future.

## 2016-03-04 NOTE — Addendum Note (Signed)
Encounter addended by: Raynelle Bring, MD on: 03/04/2016 12:32 PM<BR>     Documentation filed: Clinical Notes

## 2016-03-04 NOTE — Consult Note (Signed)
Chief Complaint  Prostate Cancer   Reason For Visit  Reason for consult: To discuss treatment options for prostate cancer. Physician requesting consult: Dr. Burman Nieves PCP: Dr. Donnie Coffin Location of consult: Prostate Cancer Multidisciplinary Clinic at [facility]   History of Present Illness  Austin Pena is a 65 year old gentleman with a past medical history significant for hypertension and chronic back pain who was noted to have a persistent elevation of the PSA by Dr. Matilde Sprang prompting referral to Dr. Louis Meckel.  His PSA remained elevated at 14.11 resulting in a TRUS biopsy of the prostate on 12/24/15 that confirmed Gleason 3+4=7 adenocarcinoma of the prostate with 6 out of 12 biopsy cores positive for malignancy.  He has no family history of prostate cancer.  He has been leaning toward surgical therapy under the care of Dr. Louis Meckel and presents today in the [facility] for a 2nd opinion.  TNM stage: cT1c Nx Mx PSA: 14.11 Gleason score: 3+4=7 Biopsy (12/24/15): 6/12 cores positive -- L lateral base (80%, 3+4=7), L base (50%, 3+4=7), R apex (90%, 3+4=7), R lateral apex (40%, 3+3=6), R lateral mid (10%, 3+3=6), R lateral base (20%, 3+3=6) Prostate volume: 52 cc  Nomogram OC disease: 28% EPE: 78% SVI: 7% LNI: 6% PFS (surgery): 74% at 5 years, 60% at 10 years  Urinary function: He describes minimal baseline lower urinary tract symptoms.  IPSS is 3. Erectile function: He has not been sexually active in the last 6 months and so it is difficult to assess his erectile function although he does admit to some erectile dysfunction having occurred over time.    Past Medical History  1. History of hypertension (Z86.79)  Surgical History  1. Denied: History of Back Surgery  2. History of Hernia Repair  3. History of Rotator Cuff Repair  4. History of Shoulder Excision   He has undergone an open left inguinal hernia repair.   Current Meds  1. Amitriptyline HCl TABS;  Therapy: (Recorded:12Sep2016) to Recorded  2. Gabapentin CAPS;  Therapy: (Recorded:12Sep2016) to Recorded  3. Hydrocodone-Acetaminophen TABS;  Therapy: (Recorded:14Feb2011) to Recorded  4. Lisinopril TABS;  Therapy: (Recorded:12Sep2016) to Recorded  Allergies  1. Aspirin TABS  Family History  1. Family history of Death In The Family Father : Father  2. Family history of hypertension (Z82.49)  Social History   Denied: History of Alcohol Use (History)   Disabled   Marital History - Single   Never smoker   Denied: History of Tobacco Use  Review of Systems AU Complete-Male: Genitourinary, constitutional, skin, eye, otolaryngeal, hematologic/lymphatic, cardiovascular, pulmonary, endocrine, musculoskeletal, gastrointestinal, neurological and psychiatric system(s) were reviewed and pertinent findings if present are noted and are otherwise negative.  Musculoskeletal: back pain.    Physical Exam Constitutional: Well nourished and well developed . No acute distress.  ENT:. The ears and nose are normal in appearance.  Neck: The appearance of the neck is normal and no neck mass is present.  Pulmonary: No respiratory distress and normal respiratory rhythm and effort.  Cardiovascular: Heart rate and rhythm are normal . No peripheral edema.  Abdomen: The abdomen is soft and nontender. No masses are palpated. No CVA tenderness. No hernias are palpable. No hepatosplenomegaly noted.  Rectal: Rectal exam demonstrates normal sphincter tone, no tenderness and no masses. The prostate has no nodularity and is not tender. The left seminal vesicle is nonpalpable. The right seminal vesicle is nonpalpable. The perineum is normal on inspection.  Lymphatics: The femoral and inguinal nodes are  not enlarged or tender.  Skin: Normal skin turgor, no visible rash and no visible skin lesions.  Neuro/Psych:. Mood and affect are appropriate.    Results/Data  I have reviewed his medical records, PSA results,  and pathology report.  I have independently reviewed his pathology slides in the multidisciplinary conference today.     Assessment  1. Prostate cancer (C61)  Discussion/Summary  1.  Prostate cancer: Austin Pena and I had a detailed discussion today.  He has seen Dr. Alen Blew and Dr. Tammi Klippel earlier this morning.  He has been recommended to undergo therapy of curative intent and we have discussed the option of either radical prostatectomy surgery versus external beam radiation therapy possibly with 6 months of androgen deprivation.   The patient was counseled about the natural history of prostate cancer and the standard treatment options that are available for prostate cancer. It was explained to him how his age and life expectancy, clinical stage, Gleason score, and PSA affect his prognosis, the decision to proceed with additional staging studies, as well as how that information influences recommended treatment strategies. We discussed the roles for active surveillance, radiation therapy, surgical therapy, androgen deprivation, as well as ablative therapy options for the treatment of prostate cancer as appropriate to his individual cancer situation. We discussed the risks and benefits of these options with regard to their impact on cancer control and also in terms of potential adverse events, complications, and impact on quiality of life particularly related to urinary, bowel, and sexual function. The patient was encouraged to ask questions throughout the discussion today and all questions were answered to his stated satisfaction. In addition, the patient was provided with and/or directed to appropriate resources and literature for further education about prostate cancer and treatment options.   We discussed surgical therapy for prostate cancer including the different available surgical approaches. We discussed, in detail, the risks and expectations of surgery with regard to cancer control, urinary  control, and erectile function as well as the expected postoperative recovery process. Additional risks of surgery including but not limited to bleeding, infection, hernia formation, nerve damage, lymphocele formation, bowel/rectal injury potentially necessitating colostomy, damage to the urinary tract resulting in urine leakage, urethral stricture, and the cardiopulmonary risks such as myocardial infarction, stroke, death, venothromboembolism, etc. were explained. The risk of open surgical conversion for robotic/laparoscopic prostatectomy was also discussed.   He feels very well informed and would like to consider his options over the next few days.  He very well may be leaning toward surgical therapy.  He therefore is apparent to undergo a bilateral nerve sparing robot-assisted laparoscopic radical prostatectomy and pelvic lymphadenectomy.  He understands it would likely be in his best interest to carefully assess his left-sided dissection intraoperatively considering this but the majority of his higher volume disease is present.  He is very aware that he is an excellent hands with Dr. Louis Meckel although I have made myself available back and be of any further assistance in his care.  Cc: Dr. Burman Nieves Dr. Donnie Coffin  A total of 65 minutes were spent in the overall care of the patient today with 55 minutes in direct face to face consultation.    Signatures Electronically signed by : Raynelle Bring, M.D.; Mar 04 2016 12:31PM EST

## 2016-03-04 NOTE — Progress Notes (Signed)
Radiation Oncology         ([phone]) [phone] ________________________________  Multidisciplinary Prostate Cancer Clinic  Initial Radiation Oncology Consultation  Name: Austin Pena MRN: [id number]  Date: 03/04/2016  DOB: December 10, 1951  [id number] NOT IN SYSTEM  Austin Bring, MD   REFERRING PHYSICIAN: Raynelle Bring, MD  DIAGNOSIS: 65 y.o. gentleman with stage T1c adenocarcinoma of the prostate with a Gleason's score of 3+4 and a PSA of 14.11    ICD-9-CM ICD-10-CM   1. Malignant neoplasm of prostate ([place]) Austin Pena is a 65 y.o. gentleman.  He was noted to have an elevated PSA of 14.11 by his primary care physician, Dr. Donnie Pena.  Accordingly, he was referred for evaluation in urology by Dr. Louis Pena,  digital rectal examination was performed at that time revealing no nodules.  The patient proceeded to transrectal ultrasound with 12 biopsies of the prostate on 12/24/15.  The prostate volume measured 52 cc.  Out of 12 core biopsies, 6 were positive.  The maximum Gleason score was 3+4, and this was seen in the left lateral base, left base, and right apex.  The patient reviewed the biopsy results with his urologist and he has kindly been referred today to the multidisciplinary prostate cancer clinic for presentation of pathology and radiology studies in our conference for discussion of potential radiation treatment options and clinical evaluation.    PREVIOUS RADIATION THERAPY: No  PAST MEDICAL HISTORY:  has a past medical history of Hypertension and Prostate cancer ([place]).    PAST SURGICAL HISTORY: Past Surgical History  Procedure Laterality Date  . Hernia repair    . Back surgery    . Rotator cuff repair    . Prostate biopsy      FAMILY HISTORY: family history includes Cancer in his father.  SOCIAL HISTORY:  reports that he has never smoked. He has never used smokeless tobacco. He reports that he does not drink alcohol or use  illicit drugs.  ALLERGIES: Aspirin; Peanut oil; and Ibuprofen  MEDICATIONS:  Current Outpatient Prescriptions  Medication Sig Dispense Refill  . amitriptyline (ELAVIL) 150 MG tablet Take 150 mg by mouth at bedtime.    . gabapentin (NEURONTIN) 100 MG capsule Take 100 mg by mouth 3 (three) times daily.    Marland Kitchen HYDROcodone-acetaminophen (NORCO/VICODIN) 5-325 MG tablet Take 1 tablet by mouth every 6 (six) hours as needed for moderate pain.    Marland Kitchen lisinopril (PRINIVIL,ZESTRIL) 10 MG tablet Take 10 mg by mouth daily.     No current facility-administered medications for this encounter.    REVIEW OF SYSTEMS:  A 15 point review of systems is documented in the electronic medical record. This was obtained by the nursing staff. However, I reviewed this with the patient to discuss relevant findings and make appropriate changes.  Pertinent items noted in HPI and remainder of comprehensive ROS otherwise negative..  The patient completed an IPSS and IIEF questionnaire.  His IPSS score was 3 indicating mild urinary outflow obstructive symptoms.  He indicated that his erectile function is not able to complete sexual activity.  65 year old male. Presents to the clinic alone. [id number]. No family hx of cancer noted in chart. Reports he is on disability and single. Abdominal surgical history: hernia surgery on the left abdomen [facility] in 2013. He lives between [place] and [place], noting that [place] would be closer for him. Denies nausea, vomiting, pain, or weight changes. He takes neurontin for nerve  pain that has bothered him for over 20 years. Last year he experienced severe chest pain with exertion but it resolved on its own.    PHYSICAL EXAM: This patient is in no acute distress.  He is alert and oriented.   height is 6' (1.829 m) and weight is 204 lb 6.4 oz (92.715 kg). His blood pressure is 146/81 and his pulse is 97. His respiration is 16 and oxygen saturation is 100%.  He exhibits no respiratory  distress or labored breathing.  He appears neurologically intact.  His mood is pleasant.  His affect is appropriate.  Please note the digital rectal exam findings described above.  KPS = 100  100 - Normal; no complaints; no evidence of disease. 90   - Able to carry on normal activity; minor signs or symptoms of disease. 80   - Normal activity with effort; some signs or symptoms of disease. 35   - Cares for self; unable to carry on normal activity or to do active work. 60   - Requires occasional assistance, but is able to care for most of his personal needs. 50   - Requires considerable assistance and frequent medical care. 56   - Disabled; requires special care and assistance. 35   - Severely disabled; hospital admission is indicated although death not imminent. 53   - Very sick; hospital admission necessary; active supportive treatment necessary. 10   - Moribund; fatal processes progressing rapidly. 0     - Dead  Karnofsky DA, Abelmann [place], Craver LS and Burchenal [facility] [phone]) The use of the nitrogen mustards in the palliative treatment of carcinoma: with particular reference to bronchogenic carcinoma Cancer 1 634-56   LABORATORY DATA:  Lab Results  Component Value Date   WBC 5.5 01/08/2013   HGB 16.3 01/08/2013   HCT 46.8 01/08/2013   MCV 85.6 01/08/2013   PLT 194 01/08/2013   Lab Results  Component Value Date   NA 137 01/08/2013   K 4.5 01/08/2013   CL 101 01/08/2013   CO2 29 01/08/2013   No results found for: ALT, AST, GGT, ALKPHOS, BILITOT   RADIOGRAPHY: No results found.    IMPRESSION: This gentleman is a pleasant 65 year-old with stage T1c adenocarcinoma of the prostate with a Gleason's score of 3+4 and a PSA of 14.11.  His T-Stage, Gleason's Score, and PSA put him into the intermediate risk group.  Accordingly he is eligible for a variety of potential treatment options including prostatectomy, external beam radiation, and androgen deprivation therapy.  PLAN: Today I  reviewed the findings and workup thus far.  We discussed the natural history of prostate cancer.  We reviewed the the implications of T-stage, Gleason's Score, and PSA on decision-making and outcomes in prostate cancer.  We discussed radiation treatment in the management of prostate cancer with regard to the logistics and delivery of external beam radiation treatment as well as the logistics and delivery of prostate brachytherapy.  We compared and contrasted each of these approaches and also compared these against prostatectomy.  The patient expressed interest in external beam radiotherapy.  I filled out a patient counseling form for him with relevant treatment diagrams and we retained a copy for our records.   The patient is undecided on whether to proceed with prostate IMRT or radical prostatectomy.  The patient will contact our patient navigator,  Cira Rue, with his decision.  I enjoyed meeting with him today, and will look forward to participating in the care of this  very nice gentleman.   I spent time face to face with the patient and more than 50% of that time was spent in counseling and/or coordination of care.   ------------------------------------------------  Sheral Apley. Tammi Klippel, M.D.     This document serves as a record of services personally performed by Tyler Pita, MD. It was created on his behalf by Arlyce Harman, a trained medical scribe. The creation of this record is based on the scribe's personal observations and the provider's statements to them. This document has been checked and approved by the attending provider.

## 2016-03-18 ENCOUNTER — Telehealth: Payer: Self-pay | Admitting: Medical Oncology

## 2016-03-18 NOTE — Telephone Encounter (Signed)
Oncology Nurse Navigator Documentation  Oncology Nurse Navigator Flowsheets 03/03/2016 03/04/2016 03/18/2016  Navigator Location - - -  Navigator Encounter Type Introductory phone call;Telephone Clinic/[facility] Telephone- Left a message requesting a return call to follow up post Prostate MDC.  Telephone Incoming Call;Appt Confirmation/Clarification - Outgoing Call;Clinic/[facility] Follow-up  Abnormal Finding Date - - -  Confirmed Diagnosis Date - - -  Patient Visit Type - Initial -  Barriers/Navigation Needs Education - -  Education Contractor;Coping with Diagnosis/ Prognosis - -  Interventions Education Method - -  Education Method Teach-back;Verbal - -  Support Groups/Services Friends and Family - -  Acuity Level 2 Level 2 -  Acuity Level 2 Initial guidance, education and coordination as needed;Educational needs Initial guidance, education and coordination as needed;Educational needs -  Time Spent with Patient - > 120 15

## 2016-04-06 ENCOUNTER — Telehealth: Payer: Self-pay | Admitting: Medical Oncology

## 2016-04-06 NOTE — Telephone Encounter (Signed)
Oncology Nurse Navigator Documentation  Oncology Nurse Navigator Flowsheets 03/04/2016 03/18/2016 04/06/2016  Navigator Location - - -  Navigator Encounter Type Clinic/[facility] Telephone Telephone  Telephone - Outgoing Call;Clinic/[facility] Follow-up Incoming Call;Clinic/[facility] Follow-up  Abnormal Finding Date - - -  Confirmed Diagnosis Date - - -  Patient Visit Type Initial - -  Barriers/Navigation Needs - - Education  Education - - Understanding Cancer/ Treatment Options- Austin Pena returned my call regarding [name] follow up. He states he has not decided on treatment yet. He had a lot of questions regarding the surgery as far as return visits and being able to drive. He states he really does not have a lot of family to help him get to and from his appointments if he can not drive. He states it is about 45 miles for him to come to [name] to get radiation so he would like to do the surgery. He is only about 20 miles from [name].If he can not arrange friends to drive for him after surgery, he would consider doing radiation. I did inform him  Dr. Tammi Klippel goes to [facility] and we could refer him there. He will call me back with his decision.   Interventions - - Education Method  Education Method - - Teach-back;Verbal  Support Groups/Services - - Friends and Family  Acuity Level 2 - Level 2  Acuity Level 2 Initial guidance, education and coordination as needed;Educational needs - Educational needs  Time Spent with Patient > 120 15 30

## 2016-04-22 ENCOUNTER — Encounter (HOSPITAL_COMMUNITY): Payer: Self-pay | Admitting: *Deleted

## 2016-04-22 ENCOUNTER — Ambulatory Visit (INDEPENDENT_AMBULATORY_CARE_PROVIDER_SITE_OTHER): Payer: Medicare PPO

## 2016-04-22 ENCOUNTER — Ambulatory Visit (HOSPITAL_COMMUNITY)
Admission: EM | Admit: 2016-04-22 | Discharge: 2016-04-22 | Disposition: A | Discharge status: Home / Self Care | Payer: Medicare PPO | Attending: Internal Medicine | Admitting: Internal Medicine

## 2016-04-22 DIAGNOSIS — IMO0001 Reserved for inherently not codable concepts without codable children: Secondary | ICD-10-CM

## 2016-04-22 DIAGNOSIS — M79644 Pain in right finger(s): Secondary | ICD-10-CM | POA: Diagnosis not present

## 2016-04-22 DIAGNOSIS — S66911A Strain of unspecified muscle, fascia and tendon at wrist and hand level, right hand, initial encounter: Secondary | ICD-10-CM

## 2016-04-22 NOTE — ED Notes (Signed)
Patient reports slamming right ring finger in door 2 weeks ago. On exam no injury or swelling noted, however patient states continued pain and pain with flexion.

## 2016-04-22 NOTE — Discharge Instructions (Signed)
Wear splint on right 4th finger at all times until you see your primary care provider in about 6 weeks, to check the healing of the finger. Xray today did not show a fracture in the finger bones, but the difficulty moving the finger suggests that a tendon involved in bending the finger was injured; these injuries often may heal with splinting.

## 2016-04-22 NOTE — ED Provider Notes (Signed)
CSN: [id number]     Arrival date & time 04/22/16  1301 History   First MD Initiated Contact with Patient 04/22/16 1322     Chief Complaint  Patient presents with  . Finger Injury   HPI  Patient is a 65 year old gentleman with pain and limited range of motion in his right fourth finger. He slammed it in the door about 2 weeks ago, it was painful and hard to move at the time, and he was hoping it would just get better. Pain and swelling have improved, but he still has difficulty with motion, particularly flexion at the DIP. When he flexes his finger, he feels like something gets stuck at the palmar surface of the PIP joint. No other injury reported.  Past Medical History  Diagnosis Date  . Hypertension   . Prostate cancer [facility])    Past Surgical History  Procedure Laterality Date  . Hernia repair    . Back surgery    . Rotator cuff repair    . Prostate biopsy     Family History  Problem Relation Age of Onset  . Cancer Father     reports his father had an enlarged prostate but, prostate ca was never confirmed.    Social History  Substance Use Topics  . Smoking status: Never Smoker   . Smokeless tobacco: Never Used  . Alcohol Use: No    Review of Systems  All other systems reviewed and are negative.   Allergies  Aspirin; Peanut oil; and Ibuprofen  Home Medications   Prior to Admission medications   Medication Sig Start Date End Date Taking? Authorizing Provider  amitriptyline (ELAVIL) 150 MG tablet Take 150 mg by mouth at bedtime.   Yes Historical Provider, MD  gabapentin (NEURONTIN) 100 MG capsule Take 100 mg by mouth 3 (three) times daily.   Yes Historical Provider, MD  HYDROcodone-acetaminophen (NORCO/VICODIN) 5-325 MG tablet Take 1 tablet by mouth every 6 (six) hours as needed for moderate pain.   Yes Historical Provider, MD  lisinopril (PRINIVIL,ZESTRIL) 10 MG tablet Take 10 mg by mouth daily.   Yes Historical Provider, MD     BP 116/79 mmHg  Pulse 101  Temp(Src)  98.2 F (36.8 C) (Oral)  Resp 16  SpO2 97%  Physical Exam  Constitutional: He is oriented to person, place, and time. No distress.  Alert, nicely groomed  HENT:  Head: Atraumatic.  Eyes:  Conjugate gaze, no eye redness/drainage  Neck: Neck supple.  Cardiovascular: Normal rate.   Pulmonary/Chest: No respiratory distress.  Abdominal: He exhibits no distension.  Musculoskeletal: Normal range of motion.  Slight swelling at PIP of R 4th finger, does not fully flex at DIP joint compared to L 4th finger.  Mild tenderness at PIP.  Skin intact, no erythema/rash.  Neurological: He is alert and oriented to person, place, and time.  Skin: Skin is warm and dry.  No cyanosis  Nursing note and vitals reviewed.   ED Course  Procedures (including critical care time)  CLINICAL DATA: Fourth digit caught in door 2 weeks ago with persistent pain, initial encounter  EXAM: RIGHT RING FINGER 2+V  COMPARISON: None.  FINDINGS: There is no evidence of fracture or dislocation. There is no evidence of arthropathy or other focal bone abnormality. Soft tissues are unremarkable.  IMPRESSION: No acute abnormality noted.   Electronically Signed  By: Inez Catalina M.D.  On: 04/22/2016 14:18    MDM   1. Strain of right ring finger, initial encounter  Will splint R 5th finger in full extension.  Patient should wear splint at all times until seen by pcp/Dean Alroy Dust in 3-6 weeks.  f    Sherlene Shams, MD 04/24/16 2350

## 2016-04-25 ENCOUNTER — Telehealth: Payer: Self-pay | Admitting: Orthopaedic Surgery

## 2016-04-25 NOTE — Telephone Encounter (Signed)
Patient stopped by our office following visit at [facility] in [facility] for problem of finger injury.  Inquiring about being seen for checking splint. His chart notes indicate he is to follow up with primary care in approximately 3 weeks. Offered appointment for next available date in order to have the finger and splint evaluated; states "just wants splint checked"; discussed that our provider would need to evaluate patient; therefore, an appointment is needed.  He states he may go back to [place], or contact his primary care doctor.

## 2016-04-26 ENCOUNTER — Ambulatory Visit (HOSPITAL_COMMUNITY)
Admission: EM | Admit: 2016-04-26 | Discharge: 2016-04-26 | Discharge status: Absent without leave | Payer: Medicare PPO | Attending: Family Medicine | Admitting: Family Medicine

## 2016-05-26 DIAGNOSIS — R42 Dizziness and giddiness: Secondary | ICD-10-CM | POA: Diagnosis not present

## 2016-05-26 DIAGNOSIS — S6990XA Unspecified injury of unspecified wrist, hand and finger(s), initial encounter: Secondary | ICD-10-CM | POA: Diagnosis not present

## 2016-06-17 DIAGNOSIS — M20021 Boutonniere deformity of right finger(s): Secondary | ICD-10-CM | POA: Diagnosis not present

## 2016-06-17 DIAGNOSIS — M79644 Pain in right finger(s): Secondary | ICD-10-CM | POA: Diagnosis not present

## 2016-06-30 DIAGNOSIS — M24541 Contracture, right hand: Secondary | ICD-10-CM | POA: Diagnosis not present

## 2016-06-30 DIAGNOSIS — M20021 Boutonniere deformity of right finger(s): Secondary | ICD-10-CM | POA: Diagnosis not present

## 2016-06-30 DIAGNOSIS — M79644 Pain in right finger(s): Secondary | ICD-10-CM | POA: Diagnosis not present

## 2016-07-07 DIAGNOSIS — M24541 Contracture, right hand: Secondary | ICD-10-CM | POA: Diagnosis not present

## 2016-07-07 DIAGNOSIS — M79644 Pain in right finger(s): Secondary | ICD-10-CM | POA: Diagnosis not present

## 2016-07-07 DIAGNOSIS — M20021 Boutonniere deformity of right finger(s): Secondary | ICD-10-CM | POA: Diagnosis not present

## 2016-07-08 DIAGNOSIS — M20021 Boutonniere deformity of right finger(s): Secondary | ICD-10-CM | POA: Diagnosis not present

## 2016-07-14 DIAGNOSIS — M20021 Boutonniere deformity of right finger(s): Secondary | ICD-10-CM | POA: Diagnosis not present

## 2016-07-14 DIAGNOSIS — M24541 Contracture, right hand: Secondary | ICD-10-CM | POA: Diagnosis not present

## 2016-07-14 DIAGNOSIS — M79644 Pain in right finger(s): Secondary | ICD-10-CM | POA: Diagnosis not present

## 2016-07-21 DIAGNOSIS — M79644 Pain in right finger(s): Secondary | ICD-10-CM | POA: Diagnosis not present

## 2016-07-21 DIAGNOSIS — M20021 Boutonniere deformity of right finger(s): Secondary | ICD-10-CM | POA: Diagnosis not present

## 2016-07-21 DIAGNOSIS — M24541 Contracture, right hand: Secondary | ICD-10-CM | POA: Diagnosis not present

## 2016-07-25 DIAGNOSIS — M150 Primary generalized (osteo)arthritis: Secondary | ICD-10-CM | POA: Diagnosis not present

## 2016-07-25 DIAGNOSIS — R42 Dizziness and giddiness: Secondary | ICD-10-CM | POA: Diagnosis not present

## 2016-07-25 DIAGNOSIS — G8929 Other chronic pain: Secondary | ICD-10-CM | POA: Diagnosis not present

## 2016-07-25 DIAGNOSIS — G4700 Insomnia, unspecified: Secondary | ICD-10-CM | POA: Diagnosis not present

## 2016-07-25 DIAGNOSIS — I10 Essential (primary) hypertension: Secondary | ICD-10-CM | POA: Diagnosis not present

## 2016-07-28 DIAGNOSIS — M20021 Boutonniere deformity of right finger(s): Secondary | ICD-10-CM | POA: Diagnosis not present

## 2016-07-28 DIAGNOSIS — M24541 Contracture, right hand: Secondary | ICD-10-CM | POA: Diagnosis not present

## 2016-07-28 DIAGNOSIS — M79644 Pain in right finger(s): Secondary | ICD-10-CM | POA: Diagnosis not present

## 2016-08-05 DIAGNOSIS — M20021 Boutonniere deformity of right finger(s): Secondary | ICD-10-CM | POA: Diagnosis not present

## 2016-08-05 DIAGNOSIS — M79644 Pain in right finger(s): Secondary | ICD-10-CM | POA: Diagnosis not present

## 2016-08-05 DIAGNOSIS — M24541 Contracture, right hand: Secondary | ICD-10-CM | POA: Diagnosis not present

## 2016-08-11 DIAGNOSIS — M20021 Boutonniere deformity of right finger(s): Secondary | ICD-10-CM | POA: Diagnosis not present

## 2016-08-11 DIAGNOSIS — M79644 Pain in right finger(s): Secondary | ICD-10-CM | POA: Diagnosis not present

## 2016-08-11 DIAGNOSIS — M24541 Contracture, right hand: Secondary | ICD-10-CM | POA: Diagnosis not present

## 2016-08-18 DIAGNOSIS — M20021 Boutonniere deformity of right finger(s): Secondary | ICD-10-CM | POA: Diagnosis not present

## 2016-08-18 DIAGNOSIS — C61 Malignant neoplasm of prostate: Secondary | ICD-10-CM | POA: Diagnosis not present

## 2016-08-18 DIAGNOSIS — M79644 Pain in right finger(s): Secondary | ICD-10-CM | POA: Diagnosis not present

## 2016-08-18 DIAGNOSIS — M24541 Contracture, right hand: Secondary | ICD-10-CM | POA: Diagnosis not present

## 2016-08-30 DIAGNOSIS — C61 Malignant neoplasm of prostate: Secondary | ICD-10-CM | POA: Diagnosis not present

## 2016-09-01 DIAGNOSIS — M24541 Contracture, right hand: Secondary | ICD-10-CM | POA: Diagnosis not present

## 2016-09-01 DIAGNOSIS — M79644 Pain in right finger(s): Secondary | ICD-10-CM | POA: Diagnosis not present

## 2016-09-01 DIAGNOSIS — M20021 Boutonniere deformity of right finger(s): Secondary | ICD-10-CM | POA: Diagnosis not present

## 2016-09-08 DIAGNOSIS — M24541 Contracture, right hand: Secondary | ICD-10-CM | POA: Diagnosis not present

## 2016-09-08 DIAGNOSIS — M79644 Pain in right finger(s): Secondary | ICD-10-CM | POA: Diagnosis not present

## 2016-09-08 DIAGNOSIS — M20021 Boutonniere deformity of right finger(s): Secondary | ICD-10-CM | POA: Diagnosis not present

## 2016-09-14 DIAGNOSIS — M79644 Pain in right finger(s): Secondary | ICD-10-CM | POA: Diagnosis not present

## 2016-09-14 DIAGNOSIS — M20021 Boutonniere deformity of right finger(s): Secondary | ICD-10-CM | POA: Diagnosis not present

## 2016-09-14 DIAGNOSIS — M24541 Contracture, right hand: Secondary | ICD-10-CM | POA: Diagnosis not present

## 2016-10-11 DIAGNOSIS — C61 Malignant neoplasm of prostate: Secondary | ICD-10-CM | POA: Diagnosis not present

## 2016-10-12 ENCOUNTER — Other Ambulatory Visit: Payer: Self-pay | Admitting: Urology

## 2016-10-18 DIAGNOSIS — C61 Malignant neoplasm of prostate: Secondary | ICD-10-CM | POA: Diagnosis not present

## 2016-10-18 DIAGNOSIS — M6281 Muscle weakness (generalized): Secondary | ICD-10-CM | POA: Diagnosis not present

## 2016-11-14 DIAGNOSIS — M20021 Boutonniere deformity of right finger(s): Secondary | ICD-10-CM | POA: Diagnosis not present

## 2016-11-25 DIAGNOSIS — M20021 Boutonniere deformity of right finger(s): Secondary | ICD-10-CM | POA: Diagnosis not present

## 2016-11-25 DIAGNOSIS — M79644 Pain in right finger(s): Secondary | ICD-10-CM | POA: Diagnosis not present

## 2016-11-25 DIAGNOSIS — M25641 Stiffness of right hand, not elsewhere classified: Secondary | ICD-10-CM | POA: Diagnosis not present

## 2016-11-30 DIAGNOSIS — C61 Malignant neoplasm of prostate: Secondary | ICD-10-CM | POA: Diagnosis not present

## 2016-11-30 DIAGNOSIS — M6281 Muscle weakness (generalized): Secondary | ICD-10-CM | POA: Diagnosis not present

## 2016-11-30 DIAGNOSIS — R278 Other lack of coordination: Secondary | ICD-10-CM | POA: Diagnosis not present

## 2016-11-30 DIAGNOSIS — N393 Stress incontinence (female) (male): Secondary | ICD-10-CM | POA: Diagnosis not present

## 2016-12-08 ENCOUNTER — Ambulatory Visit (HOSPITAL_COMMUNITY)
Admission: RE | Admit: 2016-12-08 | Discharge: 2016-12-08 | Disposition: A | Discharge status: Home / Self Care | Payer: Medicare PPO | Source: Ambulatory Visit | Attending: Urology | Admitting: Urology

## 2016-12-08 ENCOUNTER — Encounter (HOSPITAL_COMMUNITY): Payer: Self-pay

## 2016-12-08 ENCOUNTER — Encounter (HOSPITAL_COMMUNITY)
Admission: RE | Admit: 2016-12-08 | Discharge: 2016-12-08 | Disposition: A | Discharge status: Home / Self Care | Payer: Medicare PPO | Source: Ambulatory Visit | Attending: Urology | Admitting: Urology

## 2016-12-08 DIAGNOSIS — Z01810 Encounter for preprocedural cardiovascular examination: Secondary | ICD-10-CM | POA: Diagnosis not present

## 2016-12-08 DIAGNOSIS — R9431 Abnormal electrocardiogram [ECG] [EKG]: Secondary | ICD-10-CM | POA: Diagnosis not present

## 2016-12-08 DIAGNOSIS — Z01812 Encounter for preprocedural laboratory examination: Secondary | ICD-10-CM | POA: Diagnosis not present

## 2016-12-08 DIAGNOSIS — I4510 Unspecified right bundle-branch block: Secondary | ICD-10-CM | POA: Insufficient documentation

## 2016-12-08 DIAGNOSIS — C61 Malignant neoplasm of prostate: Secondary | ICD-10-CM | POA: Insufficient documentation

## 2016-12-08 DIAGNOSIS — Z01818 Encounter for other preprocedural examination: Secondary | ICD-10-CM | POA: Insufficient documentation

## 2016-12-08 HISTORY — DX: Nonspecific intraventricular block: I45.4

## 2016-12-08 HISTORY — DX: Other symptoms and signs involving the musculoskeletal system: R29.898

## 2016-12-08 HISTORY — DX: Myoneural disorder, unspecified: G70.9

## 2016-12-08 LAB — BASIC METABOLIC PANEL
Anion gap: 8 (ref 5–15)
BUN: 8 mg/dL (ref 6–20)
CALCIUM: 9.3 mg/dL (ref 8.9–10.3)
CO2: 27 mmol/L (ref 22–32)
CREATININE: 1.02 mg/dL (ref 0.61–1.24)
Chloride: 103 mmol/L (ref 101–111)
GFR calc non Af Amer: >60 mL/min (ref >60)
GLUCOSE: 97 mg/dL (ref 65–99)
Potassium: 4.6 mmol/L (ref 3.5–5.1)
Sodium: 138 mmol/L (ref 135–145)

## 2016-12-08 LAB — CBC
HCT: 43.6 % (ref 39.0–52.0)
Hemoglobin: 15.6 g/dL (ref 13.0–17.0)
MCH: 30.6 pg (ref 26.0–34.0)
MCHC: 35.8 g/dL (ref 30.0–36.0)
MCV: 85.7 fL (ref 78.0–100.0)
PLATELETS: 249 10*3/uL (ref 150–400)
RBC: 5.09 MIL/uL (ref 4.22–5.81)
RDW: 12.3 % (ref 11.5–15.5)
WBC: 5.4 10*3/uL (ref 4.0–10.5)

## 2016-12-08 LAB — ABO/RH: ABO/RH(D): A POS

## 2016-12-08 NOTE — Patient Instructions (Addendum)
Austin Pena [facility]  12/08/2016   Your procedure is scheduled on: 12-12-16   Report to [facility] Main  Entrance take [facility]  elevators to 3rd floor to  [name] at 0830 AM.  Call this number if you have problems the morning of surgery [phone] Follow Bowel Prep instructions day before(magnesium Citrate 1 bottle, Fleet Enema 1 before bedtime night before(Drink Clear Liquids plentiful day of prep).   CLEAR LIQUID DIET   Foods Allowed                                                                     Foods Excluded  Coffee and tea, regular and decaf                             liquids that you cannot  Plain Jell-O in any flavor                                             see through such as: Fruit ices (not with fruit pulp)                                     milk, soups, orange juice  Iced Popsicles                                    All solid food Carbonated beverages, regular and diet                                    Cranberry, grape and apple juices Sports drinks like Gatorade Lightly seasoned clear broth or consume(fat free) Sugar, honey syrup  _____________________________________________________________________    Remember: ONLY 1 PERSON MAY GO WITH YOU TO SHORT STAY TO GET  READY MORNING OF YOUR SURGERY.  Do not eat food or drink liquids :After Midnight.     Take these medicines the morning of surgery with A SIP OF WATER: Gabapentin . DO NOT TAKE ANY DIABETIC MEDICATIONS DAY OF YOUR SURGERY                               You may not have any metal on your body including hair pins and              piercings  Do not wear jewelry, make-up, lotions, powders or perfumes, deodorant             Do not wear nail polish.  Do not shave  48 hours prior to surgery.              Men may shave face and neck.   Do not bring valuables to the hospital. [place] IS NOT  RESPONSIBLE   FOR VALUABLES.  Contacts, dentures or bridgework may  not be worn into surgery.  Leave suitcase in the car. After surgery it may be brought to your room.     Patients discharged the day of surgery will not be allowed to drive home.  Name and phone number of your driver: Austin Pena [phone]  Special Instructions: N/A              Please read over the following fact sheets you were given: _____________________________________________________________________             [facility] - Preparing for Surgery Before surgery, you can play an important role.  Because skin is not sterile, your skin needs to be as free of germs as possible.  You can reduce the number of germs on your skin by washing with CHG (chlorahexidine gluconate) soap before surgery.  CHG is an antiseptic cleaner which kills germs and bonds with the skin to continue killing germs even after washing. Please DO NOT use if you have an allergy to CHG or antibacterial soaps.  If your skin becomes reddened/irritated stop using the CHG and inform your nurse when you arrive at Short Stay. Do not shave (including legs and underarms) for at least 48 hours prior to the first CHG shower.  You may shave your face/neck. Please follow these instructions carefully:  1.  Shower with CHG Soap the night before surgery and the  morning of Surgery.  2.  If you choose to wash your hair, wash your hair first as usual with your  normal  shampoo.  3.  After you shampoo, rinse your hair and body thoroughly to remove the  shampoo.                           4.  Use CHG as you would any other liquid soap.  You can apply chg directly  to the skin and wash                       Gently with a scrungie or clean washcloth.  5.  Apply the CHG Soap to your body ONLY FROM THE NECK DOWN.   Do not use on face/ open                           Wound or open sores. Avoid contact with eyes, ears mouth and genitals (private parts).                       Wash face,  Genitals (private parts) with your normal  soap.             6.  Wash thoroughly, paying special attention to the area where your surgery  will be performed.  7.  Thoroughly rinse your body with warm water from the neck down.  8.  DO NOT shower/wash with your normal soap after using and rinsing off  the CHG Soap.                9.  Pat yourself dry with a clean towel.            10.  Wear clean pajamas.            11.  Place clean sheets on your bed the night of your first shower and do not  sleep with pets. Day  of Surgery : Do not apply any lotions/deodorants the morning of surgery.  Please wear clean clothes to the hospital/surgery center.  FAILURE TO FOLLOW THESE INSTRUCTIONS MAY RESULT IN THE CANCELLATION OF YOUR SURGERY PATIENT SIGNATURE_________________________________  NURSE SIGNATURE__________________________________  ________________________________________________________________________   Austin Pena  An incentive spirometer is a tool that can help keep your lungs clear and active. This tool measures how well you are filling your lungs with each breath. Taking long deep breaths may help reverse or decrease the chance of developing breathing (pulmonary) problems (especially infection) following:  A long period of time when you are unable to move or be active. BEFORE THE PROCEDURE   If the spirometer includes an indicator to show your best effort, your nurse or respiratory therapist will set it to a desired goal.  If possible, sit up straight or lean slightly forward. Try not to slouch.  Hold the incentive spirometer in an upright position. INSTRUCTIONS FOR USE  1. Sit on the edge of your bed if possible, or sit up as far as you can in bed or on a chair. 2. Hold the incentive spirometer in an upright position. 3. Breathe out normally. 4. Place the mouthpiece in your mouth and seal your lips tightly around it. 5. Breathe in slowly and as deeply as possible, raising the piston or the ball toward the top of  the column. 6. Hold your breath for 3-5 seconds or for as long as possible. Allow the piston or ball to fall to the bottom of the column. 7. Remove the mouthpiece from your mouth and breathe out normally. 8. Rest for a few seconds and repeat Steps 1 through 7 at least 10 times every 1-2 hours when you are awake. Take your time and take a few normal breaths between deep breaths. 9. The spirometer may include an indicator to show your best effort. Use the indicator as a goal to work toward during each repetition. 10. After each set of 10 deep breaths, practice coughing to be sure your lungs are clear. If you have an incision (the cut made at the time of surgery), support your incision when coughing by placing a pillow or rolled up towels firmly against it. Once you are able to get out of bed, walk around indoors and cough well. You may stop using the incentive spirometer when instructed by your caregiver.  RISKS AND COMPLICATIONS  Take your time so you do not get dizzy or light-headed.  If you are in pain, you may need to take or ask for pain medication before doing incentive spirometry. It is harder to take a deep breath if you are having pain. AFTER USE  Rest and breathe slowly and easily.  It can be helpful to keep track of a log of your progress. Your caregiver can provide you with a simple table to help with this. If you are using the spirometer at home, follow these instructions: [place] IF:   You are having difficultly using the spirometer.  You have trouble using the spirometer as often as instructed.  Your pain medication is not giving enough relief while using the spirometer.  You develop fever of 100.5 F (38.1 C) or higher. SEEK IMMEDIATE MEDICAL CARE IF:   You cough up bloody sputum that had not been present before.  You develop fever of 102 F (38.9 C) or greater.  You develop worsening pain at or near the incision site. MAKE SURE YOU:   Understand these  instructions.  Will watch your condition.  Will get help right away if you are not doing well or get worse. Document Released: 04/24/2007 Document Revised: 03/05/2012 Document Reviewed: 06/25/2007 [facility] Patient Information 2014 [place], [place].   ________________________________________________________________________

## 2016-12-08 NOTE — Pre-Procedure Instructions (Signed)
EKG done  CXR done

## 2016-12-09 NOTE — H&P (Addendum)
CC/HPI: CC: Prostate Cancer   [name] is a 65 year old gentleman who I originally evaluated back in 2016-03-28 in the multidisciplinary clinic At the request of Dr. Burman Nieves. 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 He was somewhat undecided about treatment during his initial evaluation in Mar 29, 2023. He eventually followed up with Dr. Louis Meckel and was scheduled to proceed with surgical treatment of his prostate cancer last month. However, he canceled his surgery with Dr. Louis Meckel. He now presents today to consider proceeding with surgery under my care. His most recent PSA had increased to 19.2. He otherwise denies any significant changes in his overall health history.   Family history: None.   Imaging studies: None.   PMH: He has a history of hypertension and chronic back pain.  PSH: Left open inguinal hernia repair.   TNM stage: cT1c Nx Mx  PSA: 19.2  Gleason score: 3+4=7  Biopsy (12/20/16): 6/12 cores positive  Left: L lateral base (80%, 3+4=7), L base (50%, 3+4=7)  Right: R apex (90%, 3+4=7), R lateral apex (40%, 3+3=6), R lateral mid (10%, 3+3=6), R lateral base (20%, 3+3=6)  Prostate volume: 52 cc   Urinary function: IPSS is 2.  Erectile function: SHIM score is 12. He is able to sometimes achieve an erection although has significant difficulty maintaining his erection.     ALLERGIES: Aspirin TABS    MEDICATIONS: Amitriptyline HCl TABS Oral  Gabapentin CAPS Oral  Hydrocodone-Acetaminophen TABS Oral  Lisinopril TABS Oral     GU PSH: Hernia Repair - 03-28-2010      PSH Notes: Shoulder Excision, Hernia Repair, Rotator Cuff Repair, Back Surgery   NON-GU PSH: None   GU PMH: Prostate Cancer, Prostate cancer - 02/21/2016 Urinary Frequency, Increased urinary frequency - 10/16/2015 Hydrocele, Unspec, Hydrocele - March 28, 2013    NON-GU  PMH: Encounter for general adult medical examination without abnormal findings, Encounter for preventive health examination - 11/23/2015 Personal history of other diseases of the circulatory system, History of hypertension - Mar 28, 2013    FAMILY HISTORY: Death In The Family Father - Runs In Family Hypertension - Runs In Family   SOCIAL HISTORY: Marital Status: Single Has never drank.  Drinks 2 caffeinated drinks per day.     Notes: Never smoker, Disabled, Tobacco Use, Alcohol Use, Marital History - Single   REVIEW OF SYSTEMS:    GU Review Male:   Patient denies frequent urination, hard to postpone urination, burning/ pain with urination, get up at night to urinate, leakage of urine, stream starts and stops, trouble starting your streams, and have to strain to urinate .  Gastrointestinal (Upper):   Patient denies nausea and vomiting.  Gastrointestinal (Lower):   Patient denies diarrhea and constipation.  Constitutional:   Patient denies fever, night sweats, weight loss, and fatigue.  Skin:   Patient denies skin rash/ lesion and itching.  Eyes:   Patient denies blurred vision and double vision.  Ears/ Nose/ Throat:   Patient denies sore throat and sinus problems.  Hematologic/Lymphatic:   Patient denies swollen glands and easy bruising.  Cardiovascular:   Patient denies leg swelling and chest pains.  Respiratory:   Patient denies cough and shortness of breath.  Endocrine:   Patient denies excessive thirst.  Musculoskeletal:   Patient denies back pain and joint pain.  Neurological:   Patient  denies headaches and dizziness.  Psychologic:   Patient denies depression and anxiety.     MULTI-SYSTEM PHYSICAL EXAMINATION:    Constitutional: Well-nourished. No physical deformities. Normally developed. Good grooming.  Neck: Neck symmetrical, not swollen. Normal tracheal position.  Respiratory: No labored breathing, no use of accessory muscles. Clear bilaterally.  Cardiovascular: Normal temperature,  normal extremity pulses, no swelling, no varicosities. Regular rate and rhythm.  Lymphatic: No enlargement of neck, axillae, groin.  Skin: No paleness, no jaundice, no cyanosis. No lesion, no ulcer, no rash.  Neurologic / Psychiatric: Oriented to time, oriented to place, oriented to person. No depression, no anxiety, no agitation.  Gastrointestinal: No mass, no tenderness, no rigidity, non obese abdomen.  Eyes: Normal conjunctivae. Normal eyelids.  Ears, Nose, Mouth, and Throat: Left ear no scars, no lesions, no masses. Right ear no scars, no lesions, no masses. Nose no scars, no lesions, no masses. Normal hearing. Normal lips.  Musculoskeletal: Normal gait and station of head and neck.        ASSESSMENT:      ICD-10 Details  1 GU:   Prostate Cancer - C61          Notes:   1. Prostate cancer: He will undergo a bilateral nerve sparing robot-assisted laparoscopic radical prostatectomy and bilateral pelvic lymphadenectomy.   Mr. Griffee had some preoperative concerns.  We addressed these today to his stated satisfaction and reviewed the indications for treatment and the potential risks and specifically those risks regarding urinary control and erectile function postoperatively. We also addressed concerns about his social situation.  He does have a place to stay postoperatively but needs assistance with transportation.  We will have social work see him postoperatively to address his concerns to offer him help.

## 2016-12-12 ENCOUNTER — Encounter (HOSPITAL_COMMUNITY): Payer: Self-pay | Admitting: *Deleted

## 2016-12-12 ENCOUNTER — Inpatient Hospital Stay (HOSPITAL_COMMUNITY): Payer: Medicare PPO | Admitting: Certified Registered Nurse Anesthetist

## 2016-12-12 ENCOUNTER — Encounter (HOSPITAL_COMMUNITY)
Admission: RE | Disposition: A | Discharge status: Home / Self Care | Payer: Self-pay | Source: Ambulatory Visit | Attending: Urology

## 2016-12-12 ENCOUNTER — Inpatient Hospital Stay (HOSPITAL_COMMUNITY)
Admission: RE | Admit: 2016-12-12 | Discharge: 2016-12-14 | DRG: 708 | Disposition: A | Discharge status: Home / Self Care | Payer: Medicare PPO | Source: Ambulatory Visit | Attending: Urology | Admitting: Urology

## 2016-12-12 DIAGNOSIS — G8929 Other chronic pain: Secondary | ICD-10-CM | POA: Diagnosis present

## 2016-12-12 DIAGNOSIS — C61 Malignant neoplasm of prostate: Principal | ICD-10-CM | POA: Diagnosis present

## 2016-12-12 DIAGNOSIS — I10 Essential (primary) hypertension: Secondary | ICD-10-CM | POA: Diagnosis present

## 2016-12-12 DIAGNOSIS — M549 Dorsalgia, unspecified: Secondary | ICD-10-CM | POA: Diagnosis not present

## 2016-12-12 HISTORY — PX: ROBOT ASSISTED LAPAROSCOPIC RADICAL PROSTATECTOMY: SHX5141

## 2016-12-12 HISTORY — PX: LYMPHADENECTOMY: SHX5960

## 2016-12-12 LAB — HEMOGLOBIN AND HEMATOCRIT, BLOOD
HEMATOCRIT: 40.4 % (ref 39.0–52.0)
HEMOGLOBIN: 13.7 g/dL (ref 13.0–17.0)

## 2016-12-12 LAB — TYPE AND SCREEN
ABO/RH(D): A POS
Antibody Screen: NEGATIVE

## 2016-12-12 SURGERY — XI ROBOTIC ASSISTED LAPAROSCOPIC RADICAL PROSTATECTOMY LEVEL 2
Anesthesia: General | Site: Abdomen

## 2016-12-12 MED ORDER — EPHEDRINE SULFATE 50 MG/ML IJ SOLN
INTRAMUSCULAR | Status: DC | PRN
  Administered 2016-12-12: 5 mg via INTRAVENOUS

## 2016-12-12 MED ORDER — ONDANSETRON HCL 4 MG/2ML IJ SOLN
INTRAMUSCULAR | Status: AC
  Filled 2016-12-12: qty 2

## 2016-12-12 MED ORDER — AMITRIPTYLINE HCL 25 MG PO TABS
150.0000 mg | ORAL_TABLET | Freq: Every day | ORAL | Status: DC
  Administered 2016-12-12 – 2016-12-13 (×2): 150 mg via ORAL
  Filled 2016-12-12 (×2): qty 6

## 2016-12-12 MED ORDER — HYDROMORPHONE HCL 1 MG/ML IJ SOLN
INTRAMUSCULAR | Status: AC
Start: 2016-12-12 — End: 2016-12-13
  Filled 2016-12-12: qty 1

## 2016-12-12 MED ORDER — MIDAZOLAM HCL 2 MG/2ML IJ SOLN
INTRAMUSCULAR | Status: AC
  Filled 2016-12-12: qty 2

## 2016-12-12 MED ORDER — EPHEDRINE 5 MG/ML INJ
INTRAVENOUS | Status: AC
  Filled 2016-12-12: qty 10

## 2016-12-12 MED ORDER — FENTANYL CITRATE (PF) 250 MCG/5ML IJ SOLN
INTRAMUSCULAR | Status: AC
  Filled 2016-12-12: qty 5

## 2016-12-12 MED ORDER — DEXAMETHASONE SODIUM PHOSPHATE 10 MG/ML IJ SOLN
INTRAMUSCULAR | Status: AC
  Filled 2016-12-12: qty 1

## 2016-12-12 MED ORDER — ACETAMINOPHEN 10 MG/ML IV SOLN
1000.0000 mg | Freq: Four times a day (QID) | INTRAVENOUS | Status: DC
  Administered 2016-12-12 – 2016-12-13 (×2): 1000 mg via INTRAVENOUS
  Filled 2016-12-12 (×3): qty 100

## 2016-12-12 MED ORDER — BUPIVACAINE-EPINEPHRINE 0.25% -1:200000 IJ SOLN
INTRAMUSCULAR | Status: DC | PRN
  Administered 2016-12-12: 30 mL

## 2016-12-12 MED ORDER — HYDROMORPHONE HCL 1 MG/ML IJ SOLN
0.2500 mg | INTRAMUSCULAR | Status: DC | PRN
  Administered 2016-12-12 (×2): 0.5 mg via INTRAVENOUS

## 2016-12-12 MED ORDER — MIDAZOLAM HCL 5 MG/5ML IJ SOLN
INTRAMUSCULAR | Status: DC | PRN
  Administered 2016-12-12: 2 mg via INTRAVENOUS

## 2016-12-12 MED ORDER — ONDANSETRON HCL 4 MG/2ML IJ SOLN
INTRAMUSCULAR | Status: DC | PRN
  Administered 2016-12-12: 4 mg via INTRAVENOUS

## 2016-12-12 MED ORDER — FLEET ENEMA 7-19 GM/118ML RE ENEM: 1.0000 | ENEMA | Freq: Once | RECTAL | Status: DC

## 2016-12-12 MED ORDER — MORPHINE SULFATE (PF) 2 MG/ML IV SOLN
2.0000 mg | INTRAVENOUS | Status: DC | PRN
  Administered 2016-12-12: 4 mg via INTRAVENOUS
  Administered 2016-12-13 (×2): 2 mg via INTRAVENOUS
  Filled 2016-12-12: qty 1
  Filled 2016-12-12: qty 2
  Filled 2016-12-12: qty 1

## 2016-12-12 MED ORDER — PHENYLEPHRINE HCL 10 MG/ML IJ SOLN
INTRAMUSCULAR | Status: DC | PRN
  Administered 2016-12-12 (×2): 80 ug via INTRAVENOUS
  Administered 2016-12-12 (×2): 40 ug via INTRAVENOUS

## 2016-12-12 MED ORDER — LIDOCAINE HCL (CARDIAC) 20 MG/ML IV SOLN
INTRAVENOUS | Status: DC | PRN
  Administered 2016-12-12: 100 mg via INTRAVENOUS

## 2016-12-12 MED ORDER — LISINOPRIL 10 MG PO TABS
10.0000 mg | ORAL_TABLET | Freq: Every day | ORAL | Status: DC
  Administered 2016-12-12 – 2016-12-13 (×2): 10 mg via ORAL
  Filled 2016-12-12 (×2): qty 1

## 2016-12-12 MED ORDER — LACTATED RINGERS IV SOLN
INTRAVENOUS | Status: DC | PRN
  Administered 2016-12-12: 14:00:00

## 2016-12-12 MED ORDER — ONDANSETRON HCL 4 MG/2ML IJ SOLN
4.0000 mg | INTRAMUSCULAR | Status: DC | PRN
  Administered 2016-12-12: 4 mg via INTRAVENOUS
  Filled 2016-12-12 (×2): qty 2

## 2016-12-12 MED ORDER — CEFAZOLIN SODIUM-DEXTROSE 2-4 GM/100ML-% IV SOLN
2.0000 g | INTRAVENOUS | Status: AC
  Administered 2016-12-12: 2 g via INTRAVENOUS
  Filled 2016-12-12: qty 100

## 2016-12-12 MED ORDER — PHENYLEPHRINE 40 MCG/ML (10ML) SYRINGE FOR IV PUSH (FOR BLOOD PRESSURE SUPPORT)
PREFILLED_SYRINGE | INTRAVENOUS | Status: AC
  Filled 2016-12-12: qty 10

## 2016-12-12 MED ORDER — CEFAZOLIN SODIUM-DEXTROSE 2-4 GM/100ML-% IV SOLN
INTRAVENOUS | Status: AC
  Filled 2016-12-12: qty 100

## 2016-12-12 MED ORDER — SUGAMMADEX SODIUM 200 MG/2ML IV SOLN
INTRAVENOUS | Status: DC | PRN
  Administered 2016-12-12: 200 mg via INTRAVENOUS

## 2016-12-12 MED ORDER — KCL IN DEXTROSE-NACL 20-5-0.45 MEQ/L-%-% IV SOLN
INTRAVENOUS | Status: DC
  Administered 2016-12-12 – 2016-12-13 (×2): via INTRAVENOUS
  Filled 2016-12-12 (×3): qty 1000

## 2016-12-12 MED ORDER — LACTATED RINGERS IV SOLN
INTRAVENOUS | Status: DC | PRN
  Administered 2016-12-12 (×3): via INTRAVENOUS

## 2016-12-12 MED ORDER — FENTANYL CITRATE (PF) 100 MCG/2ML IJ SOLN
INTRAMUSCULAR | Status: DC | PRN
  Administered 2016-12-12: 50 ug via INTRAVENOUS
  Administered 2016-12-12 (×2): 100 ug via INTRAVENOUS

## 2016-12-12 MED ORDER — DOCUSATE SODIUM 100 MG PO CAPS
100.0000 mg | ORAL_CAPSULE | Freq: Two times a day (BID) | ORAL | Status: DC
  Administered 2016-12-12 – 2016-12-14 (×4): 100 mg via ORAL
  Filled 2016-12-12 (×4): qty 1

## 2016-12-12 MED ORDER — HYDROMORPHONE HCL 1 MG/ML IJ SOLN
INTRAMUSCULAR | Status: DC | PRN
  Administered 2016-12-12 (×2): 0.5 mg via INTRAVENOUS

## 2016-12-12 MED ORDER — ROCURONIUM BROMIDE 50 MG/5ML IV SOSY
PREFILLED_SYRINGE | INTRAVENOUS | Status: AC
  Filled 2016-12-12: qty 5

## 2016-12-12 MED ORDER — DIPHENHYDRAMINE HCL 12.5 MG/5ML PO ELIX: 12.5000 mg | ORAL_SOLUTION | Freq: Four times a day (QID) | ORAL | Status: DC | PRN

## 2016-12-12 MED ORDER — ROCURONIUM BROMIDE 100 MG/10ML IV SOLN
INTRAVENOUS | Status: DC | PRN
Start: 2016-12-12 — End: 2016-12-12
  Administered 2016-12-12 (×2): 10 mg via INTRAVENOUS
  Administered 2016-12-12: 20 mg via INTRAVENOUS
  Administered 2016-12-12: 50 mg via INTRAVENOUS

## 2016-12-12 MED ORDER — MAGNESIUM CITRATE PO SOLN: 1.0000 | Freq: Once | ORAL | Status: DC

## 2016-12-12 MED ORDER — SODIUM CHLORIDE 0.9 % IV BOLUS (SEPSIS)
1000.0000 mL | Freq: Once | INTRAVENOUS | Status: AC
  Administered 2016-12-12: 1000 mL via INTRAVENOUS

## 2016-12-12 MED ORDER — GABAPENTIN 600 MG PO TABS
600.0000 mg | ORAL_TABLET | Freq: Two times a day (BID) | ORAL | Status: DC
  Filled 2016-12-12: qty 1

## 2016-12-12 MED ORDER — DEXAMETHASONE SODIUM PHOSPHATE 10 MG/ML IJ SOLN
INTRAMUSCULAR | Status: DC | PRN
  Administered 2016-12-12: 10 mg via INTRAVENOUS

## 2016-12-12 MED ORDER — CEFAZOLIN IN D5W 1 GM/50ML IV SOLN
1.0000 g | Freq: Three times a day (TID) | INTRAVENOUS | Status: AC
  Administered 2016-12-12 – 2016-12-13 (×2): 1 g via INTRAVENOUS
  Filled 2016-12-12 (×2): qty 50

## 2016-12-12 MED ORDER — HEPARIN SODIUM (PORCINE) 1000 UNIT/ML IJ SOLN
INTRAMUSCULAR | Status: AC
  Filled 2016-12-12: qty 1

## 2016-12-12 MED ORDER — ACETAMINOPHEN 325 MG PO TABS: 650.0000 mg | ORAL_TABLET | ORAL | Status: DC | PRN

## 2016-12-12 MED ORDER — PROMETHAZINE HCL 25 MG/ML IJ SOLN: 6.2500 mg | INTRAMUSCULAR | Status: DC | PRN

## 2016-12-12 MED ORDER — HYDROCODONE-ACETAMINOPHEN 5-325 MG PO TABS: 1.0000 | ORAL_TABLET | Freq: Four times a day (QID) | ORAL | 0 refills | Status: DC | PRN

## 2016-12-12 MED ORDER — SODIUM CHLORIDE 0.9 % IR SOLN
Status: DC | PRN
  Administered 2016-12-12: 1000 mL

## 2016-12-12 MED ORDER — BUPIVACAINE HCL (PF) 0.25 % IJ SOLN
INTRAMUSCULAR | Status: AC
  Filled 2016-12-12: qty 30

## 2016-12-12 MED ORDER — GABAPENTIN 300 MG PO CAPS
600.0000 mg | ORAL_CAPSULE | Freq: Two times a day (BID) | ORAL | Status: DC
  Administered 2016-12-12 – 2016-12-14 (×4): 600 mg via ORAL
  Filled 2016-12-12 (×4): qty 2

## 2016-12-12 MED ORDER — HYDROMORPHONE HCL 2 MG/ML IJ SOLN
INTRAMUSCULAR | Status: AC
  Filled 2016-12-12: qty 1

## 2016-12-12 MED ORDER — DIPHENHYDRAMINE HCL 50 MG/ML IJ SOLN: 12.5000 mg | Freq: Four times a day (QID) | INTRAMUSCULAR | Status: DC | PRN

## 2016-12-12 MED ORDER — DOCUSATE SODIUM 100 MG PO CAPS: 100.0000 mg | ORAL_CAPSULE | Freq: Two times a day (BID) | ORAL | 0 refills | Status: AC

## 2016-12-12 MED ORDER — PROPOFOL 10 MG/ML IV BOLUS
INTRAVENOUS | Status: AC
  Filled 2016-12-12: qty 20

## 2016-12-12 MED ORDER — PROPOFOL 10 MG/ML IV BOLUS
INTRAVENOUS | Status: DC | PRN
  Administered 2016-12-12: 50 mg via INTRAVENOUS
  Administered 2016-12-12: 150 mg via INTRAVENOUS

## 2016-12-12 MED ORDER — LIDOCAINE 2% (20 MG/ML) 5 ML SYRINGE
INTRAMUSCULAR | Status: AC
  Filled 2016-12-12: qty 5

## 2016-12-12 MED ORDER — SULFAMETHOXAZOLE-TRIMETHOPRIM 800-160 MG PO TABS: 1.0000 | ORAL_TABLET | Freq: Two times a day (BID) | ORAL | 0 refills | Status: DC

## 2016-12-12 MED ORDER — SUGAMMADEX SODIUM 200 MG/2ML IV SOLN
INTRAVENOUS | Status: AC
  Filled 2016-12-12: qty 2

## 2016-12-12 MED ORDER — STERILE WATER FOR IRRIGATION IR SOLN
Status: DC | PRN
  Administered 2016-12-12: 1000 mL

## 2016-12-12 SURGICAL SUPPLY — 53 items
APPLICATOR COTTON TIP 6IN STRL (MISCELLANEOUS) ×4 IMPLANT
CATH FOLEY 2WAY SLVR 18FR 30CC (CATHETERS) ×4 IMPLANT
CATH ROBINSON RED A/P 16FR (CATHETERS) ×4 IMPLANT
CATH ROBINSON RED A/P 8FR (CATHETERS) ×4 IMPLANT
CATH TIEMANN FOLEY 18FR 5CC (CATHETERS) ×4 IMPLANT
CHLORAPREP W/TINT 26ML (MISCELLANEOUS) ×4 IMPLANT
CLIP LIGATING HEM O LOK PURPLE (MISCELLANEOUS) ×28 IMPLANT
COVER SURGICAL LIGHT HANDLE (MISCELLANEOUS) ×4 IMPLANT
COVER TIP SHEARS 8 DVNC (MISCELLANEOUS) ×2 IMPLANT
COVER TIP SHEARS 8MM DA VINCI (MISCELLANEOUS) ×2
CUTTER ECHEON FLEX ENDO 45 340 (ENDOMECHANICALS) ×4 IMPLANT
DECANTER SPIKE VIAL GLASS SM (MISCELLANEOUS) ×4 IMPLANT
DERMABOND ADVANCED (GAUZE/BANDAGES/DRESSINGS) ×2
DERMABOND ADVANCED .7 DNX12 (GAUZE/BANDAGES/DRESSINGS) ×2 IMPLANT
DRAPE ARM DVNC X/XI (DISPOSABLE) ×8 IMPLANT
DRAPE COLUMN DVNC XI (DISPOSABLE) ×2 IMPLANT
DRAPE DA VINCI XI ARM (DISPOSABLE) ×8
DRAPE DA VINCI XI COLUMN (DISPOSABLE) ×2
DRAPE SURG IRRIG POUCH 19X23 (DRAPES) ×4 IMPLANT
DRSG TEGADERM 4X4.75 (GAUZE/BANDAGES/DRESSINGS) ×4 IMPLANT
ELECT REM PT RETURN 9FT ADLT (ELECTROSURGICAL) ×4
ELECTRODE REM PT RTRN 9FT ADLT (ELECTROSURGICAL) ×2 IMPLANT
GLOVE BIO SURGEON STRL SZ 6.5 (GLOVE) ×3 IMPLANT
GLOVE BIO SURGEONS STRL SZ 6.5 (GLOVE) ×1
GLOVE BIOGEL M STRL SZ7.5 (GLOVE) ×8 IMPLANT
GOWN STRL REUS W/TWL LRG LVL3 (GOWN DISPOSABLE) ×12 IMPLANT
HOLDER FOLEY CATH W/STRAP (MISCELLANEOUS) ×4 IMPLANT
IRRIG SUCT STRYKERFLOW 2 WTIP (MISCELLANEOUS) ×4
IRRIGATION SUCT STRKRFLW 2 WTP (MISCELLANEOUS) ×2 IMPLANT
IV LACTATED RINGERS 1000ML (IV SOLUTION) ×4 IMPLANT
NDL SAFETY ECLIPSE 18X1.5 (NEEDLE) ×2 IMPLANT
NEEDLE HYPO 18GX1.5 SHARP (NEEDLE) ×2
PACK ROBOT UROLOGY CUSTOM (CUSTOM PROCEDURE TRAY) ×4 IMPLANT
RELOAD GREEN ECHELON 45 (STAPLE) ×4 IMPLANT
SEAL CANN UNIV 5-8 DVNC XI (MISCELLANEOUS) ×8 IMPLANT
SEAL XI 5MM-8MM UNIVERSAL (MISCELLANEOUS) ×8
SOLUTION ELECTROLUBE (MISCELLANEOUS) ×4 IMPLANT
SUT ETHILON 3 0 PS 1 (SUTURE) ×4 IMPLANT
SUT MNCRL 3 0 RB1 (SUTURE) ×2 IMPLANT
SUT MNCRL 3 0 VIOLET RB1 (SUTURE) ×2 IMPLANT
SUT MNCRL AB 4-0 PS2 18 (SUTURE) ×8 IMPLANT
SUT MONOCRYL 3 0 RB1 (SUTURE) ×4
SUT VIC AB 0 CT1 27 (SUTURE) ×2
SUT VIC AB 0 CT1 27XBRD ANTBC (SUTURE) ×2 IMPLANT
SUT VIC AB 0 UR5 27 (SUTURE) ×4 IMPLANT
SUT VIC AB 2-0 SH 27 (SUTURE) ×2
SUT VIC AB 2-0 SH 27X BRD (SUTURE) ×2 IMPLANT
SUT VICRYL 0 UR6 27IN ABS (SUTURE) ×8 IMPLANT
SYR 27GX1/2 1ML LL SAFETY (SYRINGE) ×4 IMPLANT
TOWEL OR 17X26 10 PK STRL BLUE (TOWEL DISPOSABLE) ×4 IMPLANT
TOWEL OR NON WOVEN STRL DISP B (DISPOSABLE) ×4 IMPLANT
TUBING INSUFFLATION 10FT LAP (TUBING) ×4 IMPLANT
WATER STERILE IRR 1500ML POUR (IV SOLUTION) ×4 IMPLANT

## 2016-12-12 NOTE — Progress Notes (Signed)
Patient had arrived for surgery this AM; however, time change due to no assistant for Dr Alinda Money this AM. Patient's brother was to drive him here. Due to emergency on brother's behalf, his brother did not drive him. Patient's car is in [facility] parking lot. Called security to alert them of his car being in lot for extended period of time. Care management may have to be involved to get patient home.

## 2016-12-12 NOTE — Op Note (Signed)
Preoperative diagnosis: Clinically localized adenocarcinoma of the prostate (clinical stage T1c Nx Mx)  Postoperative diagnosis: Clinically localized adenocarcinoma of the prostate (clinical stage T1c Nx Mx)  Procedure:  1. Robotic assisted laparoscopic radical prostatectomy (bilateral nerve sparing) 2. Bilateral robotic assisted laparoscopic pelvic lymphadenectomy  Surgeon: Pryor Curia. M.D.  Assistant: Dr. Burman Nieves  Resident: Dr. Lorayne Bender  An assistant was required for this surgical procedure.  The duties of the assistant included but were not limited to suctioning, passing suture, camera manipulation, retraction. This procedure would not be able to be performed without an Environmental consultant.  Anesthesia: General  Complications: None  EBL: 250 mL  IVF:  1300 mL crystalloid  Specimens: 1. Prostate and seminal vesicles 2. Right pelvic lymph nodes 3. Left pelvic lymph nodes  Disposition of specimens: Pathology  Drains: 1. 20 Fr coude catheter 2. # 19 Blake pelvic drain  Indication: Austin Pena is a 65 y.o. year old patient with clinically localized prostate cancer.  After a thorough review of the management options for treatment of prostate cancer, he elected to proceed with surgical therapy and the above procedure(s).  We have discussed the potential benefits and risks of the procedure, side effects of the proposed treatment, the likelihood of the patient achieving the goals of the procedure, and any potential problems that might occur during the procedure or recuperation. Informed consent has been obtained.  Description of procedure:  The patient was taken to the operating room and a general anesthetic was administered. He was given preoperative antibiotics, placed in the dorsal lithotomy position, and prepped and draped in the usual sterile fashion. Next a preoperative timeout was performed. A urethral catheter was placed into the bladder and a site was selected  near the umbilicus for placement of the camera port. This was placed using a standard open Hassan technique which allowed entry into the peritoneal cavity under direct vision and without difficulty. An 8 mm robotic port was placed and a pneumoperitoneum established. The camera was then used to inspect the abdomen and there was no evidence of any intra-abdominal injuries or other abnormalities. The remaining abdominal ports were then placed. 8 mm robotic ports were placed in the right lower quadrant, left lower quadrant, and far left lateral abdominal wall. A 5 mm port was placed in the right upper quadrant and a 12 mm port was placed in the right lateral abdominal wall for laparoscopic assistance. All ports were placed under direct vision without difficulty. The surgical cart was then docked.   Utilizing the cautery scissors, the bladder was reflected posteriorly allowing entry into the space of Retzius and identification of the endopelvic fascia and prostate. The periprostatic fat was then removed from the prostate allowing full exposure of the endopelvic fascia. The endopelvic fascia was then incised from the apex back to the base of the prostate bilaterally and the underlying levator muscle fibers were swept laterally off the prostate thereby isolating the dorsal venous complex. The dorsal vein was then stapled and divided with a 45 mm Flex Echelon stapler. Attention then turned to the bladder neck which was divided anteriorly thereby allowing entry into the bladder and exposure of the urethral catheter. The catheter balloon was deflated and the catheter was brought into the operative field and used to retract the prostate anteriorly. The posterior bladder neck was then examined and was divided allowing further dissection between the bladder and prostate posteriorly until the vasa deferentia and seminal vessels were identified. The vasa deferentia were  isolated, divided, and lifted anteriorly. The seminal  vesicles were dissected down to their tips with care to control the seminal vascular arterial blood supply. These structures were then lifted anteriorly and the space between Denonvillier's fascia and the anterior rectum was developed with a combination of sharp and blunt dissection. This isolated the vascular pedicles of the prostate.  The lateral prostatic fascia was then sharply incised allowing release of the neurovascular bundles bilaterally. The vascular pedicles of the prostate were then ligated with Weck clips between the prostate and neurovascular bundles and divided with sharp cold scissor dissection resulting in neurovascular bundle preservation. The neurovascular bundles were then separated off the apex of the prostate and urethra bilaterally.  The urethra was then sharply transected allowing the prostate specimen to be disarticulated. The pelvis was copiously irrigated and hemostasis was ensured. There was no evidence for rectal injury.  Attention then turned to the right pelvic sidewall. The fibrofatty tissue between the external iliac vein, confluence of the iliac vessels, hypogastric artery, and Cooper's ligament was dissected free from the pelvic sidewall with care to preserve the obturator nerve. Weck clips were used for lymphostasis and hemostasis. An identical procedure was performed on the contralateral side and the lymphatic packets were removed for permanent pathologic analysis.  Attention then turned to the urethral anastomosis. A 2-0 Vicryl slip knot was placed between Denonvillier's fascia, the posterior bladder neck, and the posterior urethra to reapproximate these structures. A double-armed 3-0 Monocryl suture was then used to perform a 360 running tension-free anastomosis between the bladder neck and urethra. A new urethral catheter was then placed into the bladder and irrigated. There were no blood clots within the bladder and the anastomosis appeared to be watertight. A #19  Blake drain was then brought through the left lateral 8 mm port site and positioned appropriately within the pelvis. It was secured to the skin with a nylon suture. The surgical cart was then undocked. The right lateral 12 mm port site was closed at the fascial level with a 0 Vicryl suture placed laparoscopically. All remaining ports were then removed under direct vision. The prostate specimen was removed intact within the Endopouch retrieval bag via the periumbilical camera port site. This fascial opening was closed with two running 0 Vicryl sutures. 0.25% Marcaine was then injected into all port sites and all incisions were reapproximated at the skin level with 4-0 Monocryl subcuticular sutures and Liquiband. The patient appeared to tolerate the procedure well and without complications. The patient was able to be extubated and transferred to the recovery unit in satisfactory condition.   Pryor Curia MD

## 2016-12-12 NOTE — Transfer of Care (Signed)
Immediate Anesthesia Transfer of Care Note  Patient: Austin Pena  Procedure(s) Performed: Procedure(s): XI ROBOTIC ASSISTED LAPAROSCOPIC RADICAL PROSTATECTOMY LEVEL 2 (N/A) PELVIC LYMPHADENECTOMY (Bilateral)  Patient Location: PACU  Anesthesia Type:General  Level of Consciousness: awake, alert  and oriented  Airway & Oxygen Therapy: Patient Spontanous Breathing and Patient connected to face mask oxygen  Post-op Assessment: Report given to RN and Post -op Vital signs reviewed and stable  Post vital signs: Reviewed and stable  Last Vitals:  Vitals:   12/12/16 1236  BP: 135/87  Pulse: (!) 121  Resp: 18  Temp: 36.6 C    Last Pain:  Vitals:   12/12/16 1236  TempSrc: Oral         Complications: No apparent anesthesia complications

## 2016-12-12 NOTE — Anesthesia Postprocedure Evaluation (Signed)
Anesthesia Post Note  Patient: Austin Pena  Procedure(s) Performed: Procedure(s) (LRB): XI ROBOTIC ASSISTED LAPAROSCOPIC RADICAL PROSTATECTOMY LEVEL 2 (N/A) PELVIC LYMPHADENECTOMY (Bilateral)  Patient location during evaluation: PACU Anesthesia Type: General Level of consciousness: awake and alert Pain management: pain level controlled Vital Signs Assessment: post-procedure vital signs reviewed and stable Respiratory status: spontaneous breathing, nonlabored ventilation, respiratory function stable and patient connected to nasal cannula oxygen Cardiovascular status: blood pressure returned to baseline and stable Postop Assessment: no signs of nausea or vomiting Anesthetic complications: no       Last Vitals:  Vitals:   12/12/16 1236 12/12/16 1745  BP: 135/87 139/77  Pulse: (!) 121 94  Resp: 18 18  Temp: 36.6 C 36.6 C    Last Pain:  Vitals:   12/12/16 1745  TempSrc:   PainSc: Asleep                 Mesha Schamberger S

## 2016-12-12 NOTE — Anesthesia Procedure Notes (Signed)
Procedure Name: Intubation Date/Time: 12/12/2016 2:09 PM Performed by: Glory Buff Pre-anesthesia Checklist: Patient identified, Emergency Drugs available, Suction available and Patient being monitored Patient Re-evaluated:Patient Re-evaluated prior to inductionOxygen Delivery Method: Circle system utilized Preoxygenation: Pre-oxygenation with 100% oxygen Intubation Type: IV induction Ventilation: Mask ventilation without difficulty Laryngoscope Size: Miller and 3 Grade View: Grade I Tube type: Oral Tube size: 7.5 mm Number of attempts: 1 Airway Equipment and Method: Stylet and Oral airway Placement Confirmation: ETT inserted through vocal cords under direct vision,  positive ETCO2 and breath sounds checked- equal and bilateral Secured at: 21 cm Tube secured with: Tape Dental Injury: Teeth and Oropharynx as per pre-operative assessment

## 2016-12-12 NOTE — Anesthesia Preprocedure Evaluation (Addendum)
Anesthesia Evaluation  Patient identified by MRN, date of birth, ID band Patient awake    Reviewed: Allergy & Precautions, NPO status , Patient's Chart, lab work & pertinent test results  Airway Mallampati: II  TM Distance: >3 FB Neck ROM: Full    Dental  (+) Dental Advisory Given   Pulmonary neg pulmonary ROS,    breath sounds clear to auscultation       Cardiovascular METS: 5 - 7 Mets hypertension, Pt. on medications (-) angina(-) DOE + dysrhythmias (RBBB)  Rhythm:Regular Rate:Normal     Neuro/Psych negative neurological ROS     GI/Hepatic negative GI ROS, Neg liver ROS,   Endo/Other  negative endocrine ROS  Renal/GU negative Renal ROS     Musculoskeletal   Abdominal   Peds  Hematology negative hematology ROS (+)   Anesthesia Other Findings   Reproductive/Obstetrics                            Lab Results  Component Value Date   WBC 5.4 12/08/2016   HGB 15.6 12/08/2016   HCT 43.6 12/08/2016   MCV 85.7 12/08/2016   PLT 249 12/08/2016   Lab Results  Component Value Date   CREATININE 1.02 12/08/2016   BUN 8 12/08/2016   NA 138 12/08/2016   K 4.6 12/08/2016   CL 103 12/08/2016   CO2 27 12/08/2016    Anesthesia Physical Anesthesia Plan  ASA: II  Anesthesia Plan: General   Post-op Pain Management:    Induction: Intravenous  Airway Management Planned: Oral ETT  Additional Equipment:   Intra-op Plan:   Post-operative Plan: Extubation in OR  Informed Consent: I have reviewed the patients History and Physical, chart, labs and discussed the procedure including the risks, benefits and alternatives for the proposed anesthesia with the patient or authorized representative who has indicated his/her understanding and acceptance.   Dental advisory given  Plan Discussed with: CRNA  Anesthesia Plan Comments:         Anesthesia Quick Evaluation

## 2016-12-12 NOTE — Progress Notes (Signed)
Patient ID: Austin Pena, male   DOB: Jul 09, 1951, 65 y.o.   MRN: [id number]  Post-op note  Subjective: The patient is doing well.  No complaints.  Objective: Vital signs in last 24 hours: Temp:  [97.6 F (36.4 C)-97.9 F (36.6 C)] 97.6 F (36.4 C) (12/18 1843) Pulse Rate:  [93-121] 96 (12/18 1843) Resp:  [12-18] 14 (12/18 1843) BP: (135-166)/(75-87) 144/75 (12/18 1843) SpO2:  [96 %-100 %] 96 % (12/18 1843) Weight:  [90.7 kg (200 lb)] 90.7 kg (200 lb) (12/18 1258)  Intake/Output from previous day: No intake/output data recorded. Intake/Output this shift: Total I/O In: -  Out: 40 [Drains:40]  Physical Exam:  General: Alert and oriented. Abdomen: Soft, Nondistended. Incisions: Clean and dry.  Lab Results:  Recent Labs  12/12/16 1735  HGB 13.7  HCT 40.4    Assessment/Plan: POD#0   1) Continue to monitor, ambulate, IS   Austin Pena. MD   LOS: 0 days   Genevra Orne,LES 12/12/2016, 8:03 PM

## 2016-12-13 ENCOUNTER — Encounter (HOSPITAL_COMMUNITY): Payer: Self-pay | Admitting: Urology

## 2016-12-13 ENCOUNTER — Encounter: Payer: Self-pay | Admitting: Medical Oncology

## 2016-12-13 LAB — HEMOGLOBIN AND HEMATOCRIT, BLOOD
HEMATOCRIT: 41.1 % (ref 39.0–52.0)
HEMOGLOBIN: 14.2 g/dL (ref 13.0–17.0)

## 2016-12-13 MED ORDER — MENTHOL 3 MG MT LOZG
1.0000 | LOZENGE | OROMUCOSAL | Status: DC | PRN
  Filled 2016-12-13: qty 9

## 2016-12-13 MED ORDER — HYDROCODONE-ACETAMINOPHEN 5-325 MG PO TABS
1.0000 | ORAL_TABLET | Freq: Four times a day (QID) | ORAL | Status: DC | PRN
  Administered 2016-12-13 – 2016-12-14 (×3): 1 via ORAL
  Filled 2016-12-13 (×4): qty 1

## 2016-12-13 MED ORDER — BISACODYL 10 MG RE SUPP
10.0000 mg | Freq: Once | RECTAL | Status: AC
  Administered 2016-12-13: 10 mg via RECTAL
  Filled 2016-12-13: qty 1

## 2016-12-13 NOTE — Discharge Instructions (Signed)

## 2016-12-13 NOTE — Progress Notes (Signed)
Pt states he is going to his brother and sisters house about 30-45 minutes away never [place]. Unable to provide transportation to pt related distance.  Pt  continued to state that he have his car here. Will continue to follow.

## 2016-12-13 NOTE — Progress Notes (Signed)
Patient ID: Austin Pena, male   DOB: 07-Apr-1951, 65 y.o.   MRN: [id number]  1 Day Post-Op Subjective: The patient is doing well.  No nausea or vomiting. Pain is adequately controlled.  Objective: Vital signs in last 24 hours: Temp:  [97.6 F (36.4 C)-98.4 F (36.9 C)] 98.4 F (36.9 C) (12/19 0548) Pulse Rate:  [80-121] 80 (12/19 0548) Resp:  [12-18] 16 (12/19 0548) BP: (135-166)/(75-87) 142/76 (12/19 0548) SpO2:  [96 %-100 %] 99 % (12/19 0548) Weight:  [90.7 kg (200 lb)] 90.7 kg (200 lb) (12/18 1258)  Intake/Output from previous day: 12/18 0701 - 12/19 0700 In: 5487.5 [I.V.:4187.5; IV Piggyback:1300] Out: [id number] [Urine:1275; Drains:180; Blood:250] Intake/Output this shift: No intake/output data recorded.  Physical Exam:  General: Alert and oriented. CV: RRR Lungs: Clear bilaterally. GI: Soft, Nondistended. Incisions: Clean, dry, and intact Urine: Clear Extremities: Nontender, no erythema, no edema.  Lab Results:  Recent Labs  12/12/16 1735 12/13/16 0609  HGB 13.7 14.2  HCT 40.4 41.1      Assessment/Plan: POD# 1 s/p robotic prostatectomy.  1) SL IVF 2) Ambulate, Incentive spirometry 3) Transition to oral pain medication 4) Dulcolax suppository 5) D/C pelvic drain 6) Plan for likely discharge later today if transportation can be arranged per social work   Austin Pena, Austin Pena. MD   LOS: 1 day   Austin Pena,Austin Pena 12/13/2016, 7:43 AM

## 2016-12-13 NOTE — Progress Notes (Signed)
Discussed the importance of walking and deep breathing post op. He voiced understanding. He will follow up with Dr. Alinda Money 12/20/17 for removal of catheter. I gave him my card and asked him to call me with any questions or concerns.

## 2016-12-13 NOTE — Discharge Summary (Signed)
  Date of admission: 12/12/2016  Date of discharge: 12/14/2016  Admission diagnosis: Prostate Cancer  Discharge diagnosis: Prostate Cancer  History and Physical: For full details, please see admission history and physical. Briefly, Austin Pena is a 65 y.o. gentleman with localized prostate cancer.  After discussing management/treatment options, he elected to proceed with surgical treatment.  Hospital Course: Austin Pena was taken to the operating room on 12/12/2016 and underwent a robotic assisted laparoscopic radical prostatectomy. He tolerated this procedure well and without complications. Postoperatively, he was able to be transferred to a regular hospital room following recovery from anesthesia.  He was able to begin ambulating the night of surgery. He remained hemodynamically stable overnight.  He had excellent urine output with appropriately minimal output from his pelvic drain and his pelvic drain was removed on POD #1.  He was transitioned to oral pain medication, tolerated a clear liquid diet. He was kept until POD#2 because of social reasons (need for ride). By midday POD#2, he was discharged home.  Laboratory values:   Recent Labs  12/12/16 1735 12/13/16 0609  HGB 13.7 14.2  HCT 40.4 41.1    Disposition: Home  Discharge instruction: He was instructed to be ambulatory but to refrain from heavy lifting, strenuous activity, or driving. He was instructed on urethral catheter care.  Discharge medications:   Allergies as of 12/14/2016      Reactions   Aspirin Other (See Comments)   Upset stomach.   Ibuprofen Nausea Only   Peanut Oil Other (See Comments)   Upset stomach      Medication List    TAKE these medications   amitriptyline 75 MG tablet Commonly known as:  ELAVIL Take 150 mg by mouth at bedtime.   docusate sodium 100 MG capsule Commonly known as:  COLACE Take 1 capsule (100 mg total) by mouth 2 (two) times daily.   gabapentin 600 MG  tablet Commonly known as:  NEURONTIN Take 600 mg by mouth 2 (two) times daily.   HYDROcodone-acetaminophen 5-325 MG tablet Commonly known as:  NORCO/VICODIN Take 1 tablet by mouth every 6 (six) hours as needed for moderate pain. What changed:  Another medication with the same name was added. Make sure you understand how and when to take each.   HYDROcodone-acetaminophen 5-325 MG tablet Commonly known as:  NORCO/VICODIN Take 1-2 tablets by mouth every 6 (six) hours as needed. What changed:  You were already taking a medication with the same name, and this prescription was added. Make sure you understand how and when to take each. Notes to patient:  Take 2 for severe pain   lisinopril 10 MG tablet Commonly known as:  PRINIVIL,ZESTRIL Take 10 mg by mouth daily.   sulfamethoxazole-trimethoprim 800-160 MG tablet Commonly known as:  BACTRIM DS,SEPTRA DS Take 1 tablet by mouth 2 (two) times daily. Begin one day prior to follow up appointment for catheter removal. Notes to patient:  BEGIN 1 [place].       Followup: He will followup in 1 week for catheter removal and to discuss his surgical pathology results.

## 2016-12-14 ENCOUNTER — Encounter: Payer: Self-pay | Admitting: Medical Oncology

## 2016-12-14 NOTE — Progress Notes (Signed)
Mr. Meave transportation home post op did not work out.  He has no family members or friends that can take him home.I called [employer] and got rate to take him to [place], [place] to his sister's home. A taxi voucher was issued from [employer] thru  [place]. I gave the voucher to pt's case manager who will give to cab driver at pick up. All the above was discussed with Mr. Fomby. He did drive his car to the hospital and he will pick it up when he returns to get his catheter out 12/20/16. He has my business card and I asked him to call me with any questions or concerns. I will continue to follow him.

## 2016-12-14 NOTE — Progress Notes (Signed)
Patient ID: Austin Pena, male   DOB: 05-28-1951, 65 y.o.   MRN: [id number]  2 Days Post-Op Subjective: Pt doing well clinically.  Awaiting transportation to his sister's house.  Objective: Vital signs in last 24 hours: Temp:  [98 F (36.7 C)-99 F (37.2 C)] 98.6 F (37 C) (12/20 0604) Pulse Rate:  [90-104] 97 (12/20 0604) Resp:  [18-20] 20 (12/20 0604) BP: (121-156)/(64-91) 127/64 (12/20 0604) SpO2:  [95 %-97 %] 97 % (12/20 0604)  Intake/Output from previous day: 12/19 0701 - 12/20 0700 In: -  Out: [place] [Urine:4375; Drains:70] Intake/Output this shift: Total I/O In: 480 [P.O.:480] Out: 200 [Urine:200]  Physical Exam:  General: Alert and oriented Abdomen: Soft, ND GU: Urine clear  Lab Results:  Recent Labs  12/12/16 1735 12/13/16 0609  HGB 13.7 14.2  HCT 40.4 41.1   BMET No results for input(s): NA, K, CL, CO2, GLUCOSE, BUN, CREATININE, CALCIUM in the last 72 hours.   Studies/Results: No results found.  Assessment/Plan: Encouraged ambulation D/C home once transportation arranged.   LOS: 2 days   Arleatha Philipps,LES 12/14/2016, 9:58 AM

## 2016-12-14 NOTE — Progress Notes (Signed)
Thanks to [facility] for providing pt transportation home. Thank you Dr. Alinda Money and Shirlean Mylar, RN.

## 2016-12-27 DIAGNOSIS — C61 Malignant neoplasm of prostate: Secondary | ICD-10-CM | POA: Diagnosis not present

## 2016-12-30 DIAGNOSIS — C61 Malignant neoplasm of prostate: Secondary | ICD-10-CM | POA: Diagnosis not present

## 2017-01-24 DIAGNOSIS — M20021 Boutonniere deformity of right finger(s): Secondary | ICD-10-CM | POA: Diagnosis not present

## 2017-01-25 DIAGNOSIS — G4700 Insomnia, unspecified: Secondary | ICD-10-CM | POA: Diagnosis not present

## 2017-01-25 DIAGNOSIS — Z23 Encounter for immunization: Secondary | ICD-10-CM | POA: Diagnosis not present

## 2017-01-25 DIAGNOSIS — G8929 Other chronic pain: Secondary | ICD-10-CM | POA: Diagnosis not present

## 2017-01-25 DIAGNOSIS — I10 Essential (primary) hypertension: Secondary | ICD-10-CM | POA: Diagnosis not present

## 2017-01-30 DIAGNOSIS — M79644 Pain in right finger(s): Secondary | ICD-10-CM | POA: Diagnosis not present

## 2017-01-30 DIAGNOSIS — M25641 Stiffness of right hand, not elsewhere classified: Secondary | ICD-10-CM | POA: Diagnosis not present

## 2017-01-30 DIAGNOSIS — M24541 Contracture, right hand: Secondary | ICD-10-CM | POA: Diagnosis not present

## 2017-01-30 DIAGNOSIS — M20021 Boutonniere deformity of right finger(s): Secondary | ICD-10-CM | POA: Diagnosis not present

## 2017-02-06 DIAGNOSIS — M20021 Boutonniere deformity of right finger(s): Secondary | ICD-10-CM | POA: Diagnosis not present

## 2017-02-06 DIAGNOSIS — M24541 Contracture, right hand: Secondary | ICD-10-CM | POA: Diagnosis not present

## 2017-02-06 DIAGNOSIS — M79644 Pain in right finger(s): Secondary | ICD-10-CM | POA: Diagnosis not present

## 2017-02-13 DIAGNOSIS — C61 Malignant neoplasm of prostate: Secondary | ICD-10-CM | POA: Diagnosis not present

## 2017-02-13 DIAGNOSIS — R8271 Bacteriuria: Secondary | ICD-10-CM | POA: Diagnosis not present

## 2017-02-27 DIAGNOSIS — M25641 Stiffness of right hand, not elsewhere classified: Secondary | ICD-10-CM | POA: Diagnosis not present

## 2017-02-27 DIAGNOSIS — M20021 Boutonniere deformity of right finger(s): Secondary | ICD-10-CM | POA: Diagnosis not present

## 2017-02-27 DIAGNOSIS — M24541 Contracture, right hand: Secondary | ICD-10-CM | POA: Diagnosis not present

## 2017-03-13 DIAGNOSIS — M25641 Stiffness of right hand, not elsewhere classified: Secondary | ICD-10-CM | POA: Diagnosis not present

## 2017-03-13 DIAGNOSIS — M24541 Contracture, right hand: Secondary | ICD-10-CM | POA: Diagnosis not present

## 2017-03-13 DIAGNOSIS — M20021 Boutonniere deformity of right finger(s): Secondary | ICD-10-CM | POA: Diagnosis not present

## 2017-03-27 DIAGNOSIS — M25641 Stiffness of right hand, not elsewhere classified: Secondary | ICD-10-CM | POA: Diagnosis not present

## 2017-03-27 DIAGNOSIS — M24541 Contracture, right hand: Secondary | ICD-10-CM | POA: Diagnosis not present

## 2017-03-27 DIAGNOSIS — M20021 Boutonniere deformity of right finger(s): Secondary | ICD-10-CM | POA: Diagnosis not present

## 2017-04-10 DIAGNOSIS — M24541 Contracture, right hand: Secondary | ICD-10-CM | POA: Diagnosis not present

## 2017-04-10 DIAGNOSIS — M25641 Stiffness of right hand, not elsewhere classified: Secondary | ICD-10-CM | POA: Diagnosis not present

## 2017-04-10 DIAGNOSIS — M20021 Boutonniere deformity of right finger(s): Secondary | ICD-10-CM | POA: Diagnosis not present

## 2017-04-12 DIAGNOSIS — L259 Unspecified contact dermatitis, unspecified cause: Secondary | ICD-10-CM | POA: Diagnosis not present

## 2017-04-12 DIAGNOSIS — C61 Malignant neoplasm of prostate: Secondary | ICD-10-CM | POA: Diagnosis not present

## 2017-04-19 DIAGNOSIS — N393 Stress incontinence (female) (male): Secondary | ICD-10-CM | POA: Diagnosis not present

## 2017-04-19 DIAGNOSIS — C61 Malignant neoplasm of prostate: Secondary | ICD-10-CM | POA: Diagnosis not present

## 2017-04-19 DIAGNOSIS — N5201 Erectile dysfunction due to arterial insufficiency: Secondary | ICD-10-CM | POA: Diagnosis not present

## 2017-07-25 DIAGNOSIS — G8929 Other chronic pain: Secondary | ICD-10-CM | POA: Diagnosis not present

## 2017-07-25 DIAGNOSIS — L309 Dermatitis, unspecified: Secondary | ICD-10-CM | POA: Diagnosis not present

## 2017-07-25 DIAGNOSIS — I10 Essential (primary) hypertension: Secondary | ICD-10-CM | POA: Diagnosis not present

## 2017-07-25 DIAGNOSIS — G4700 Insomnia, unspecified: Secondary | ICD-10-CM | POA: Diagnosis not present

## 2017-09-04 ENCOUNTER — Other Ambulatory Visit: Payer: Self-pay | Admitting: Family Medicine

## 2017-09-04 ENCOUNTER — Ambulatory Visit
Admission: RE | Admit: 2017-09-04 | Discharge: 2017-09-04 | Disposition: A | Discharge status: Home / Self Care | Payer: Medicare PPO | Source: Ambulatory Visit | Attending: Family Medicine | Admitting: Family Medicine

## 2017-09-04 DIAGNOSIS — M545 Low back pain: Secondary | ICD-10-CM

## 2017-09-04 DIAGNOSIS — M48061 Spinal stenosis, lumbar region without neurogenic claudication: Secondary | ICD-10-CM | POA: Diagnosis not present

## 2017-09-04 DIAGNOSIS — M79604 Pain in right leg: Secondary | ICD-10-CM | POA: Diagnosis not present

## 2017-10-19 DIAGNOSIS — C61 Malignant neoplasm of prostate: Secondary | ICD-10-CM | POA: Diagnosis not present

## 2017-10-22 IMAGING — CR DG CHEST 2V
2 series · 2 of 2 positions shown · non-contrast
Comparison: 01/08/2013

CLINICAL DATA: Preop for prostate surgery

EXAM:
CHEST  2 VIEW

[w chest pa]
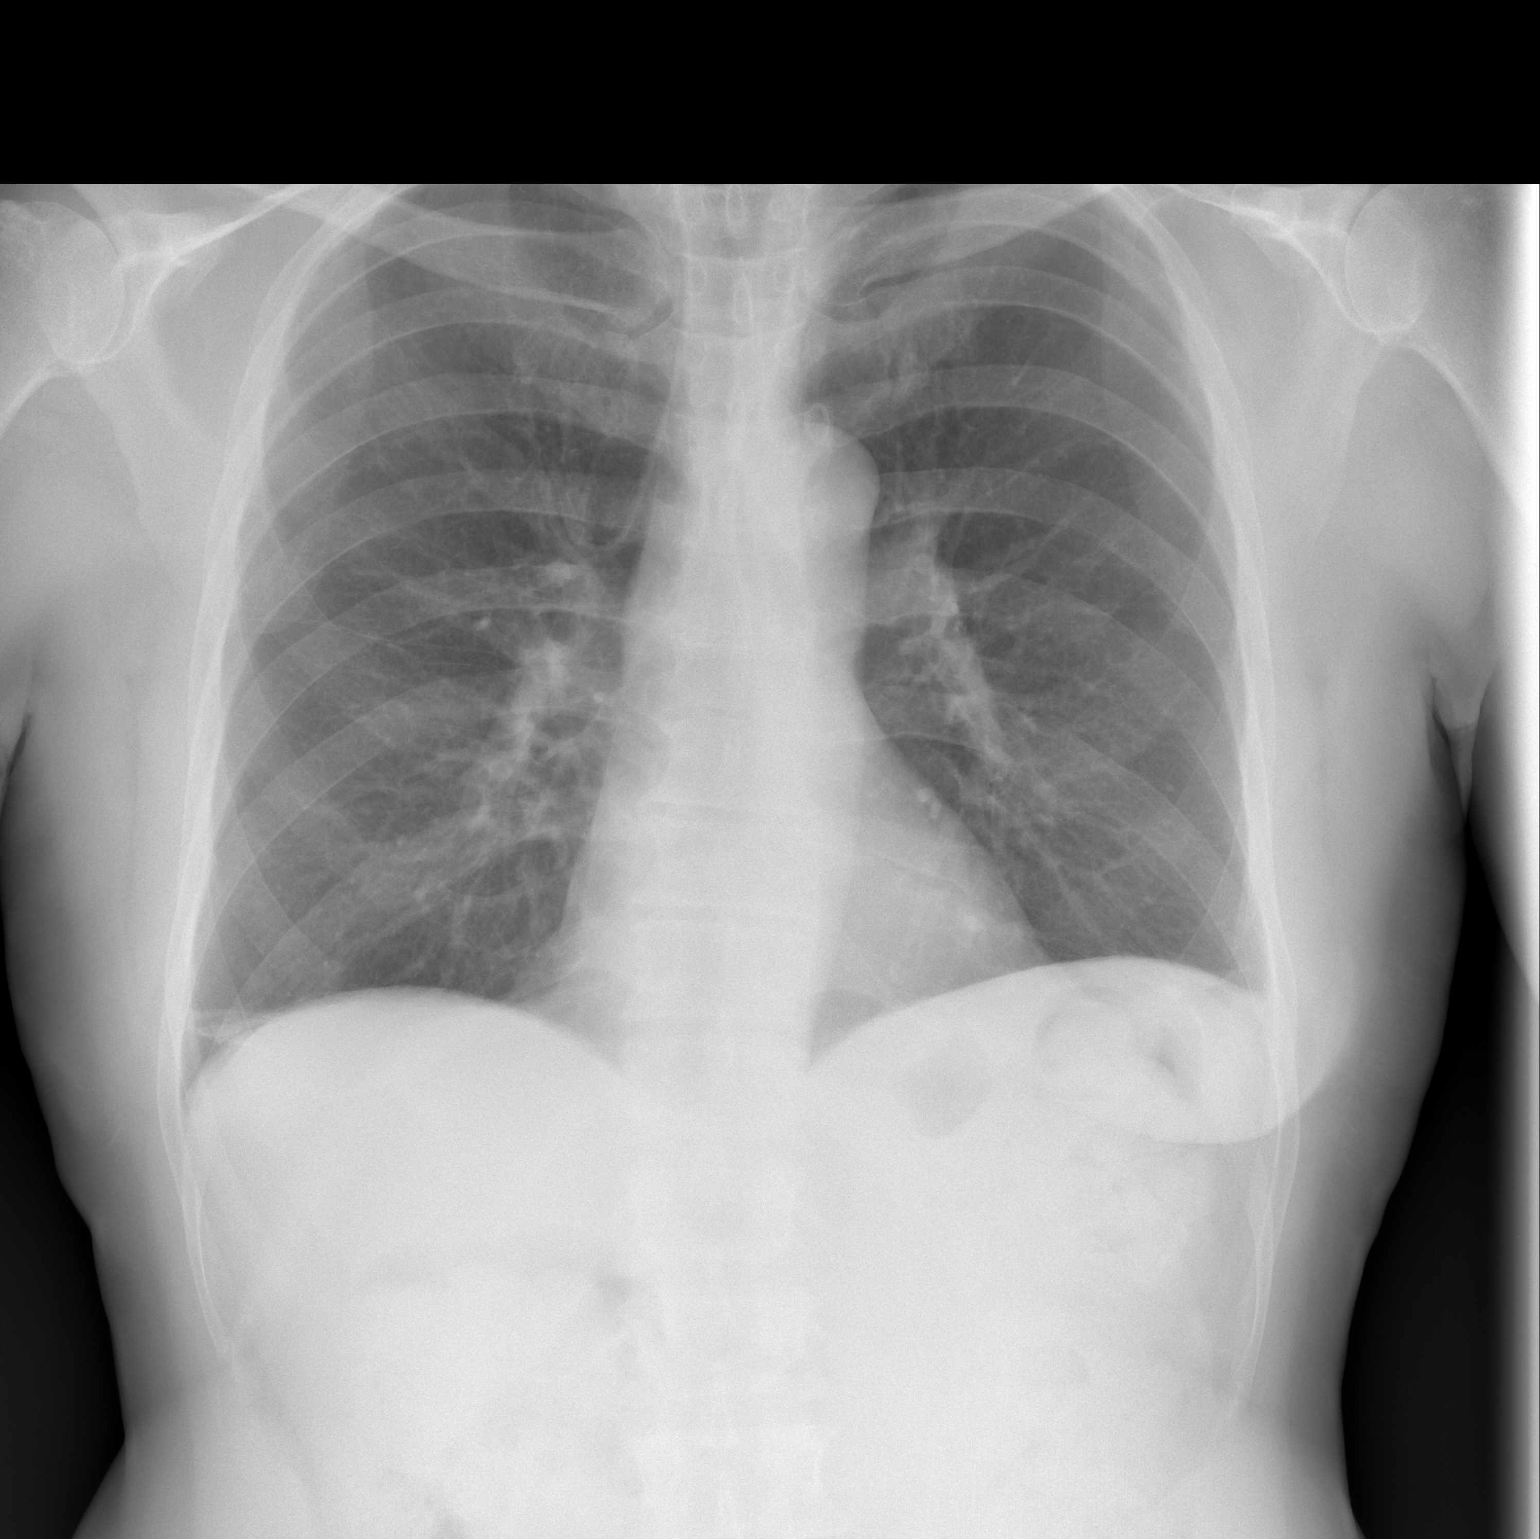

[w chest lat]
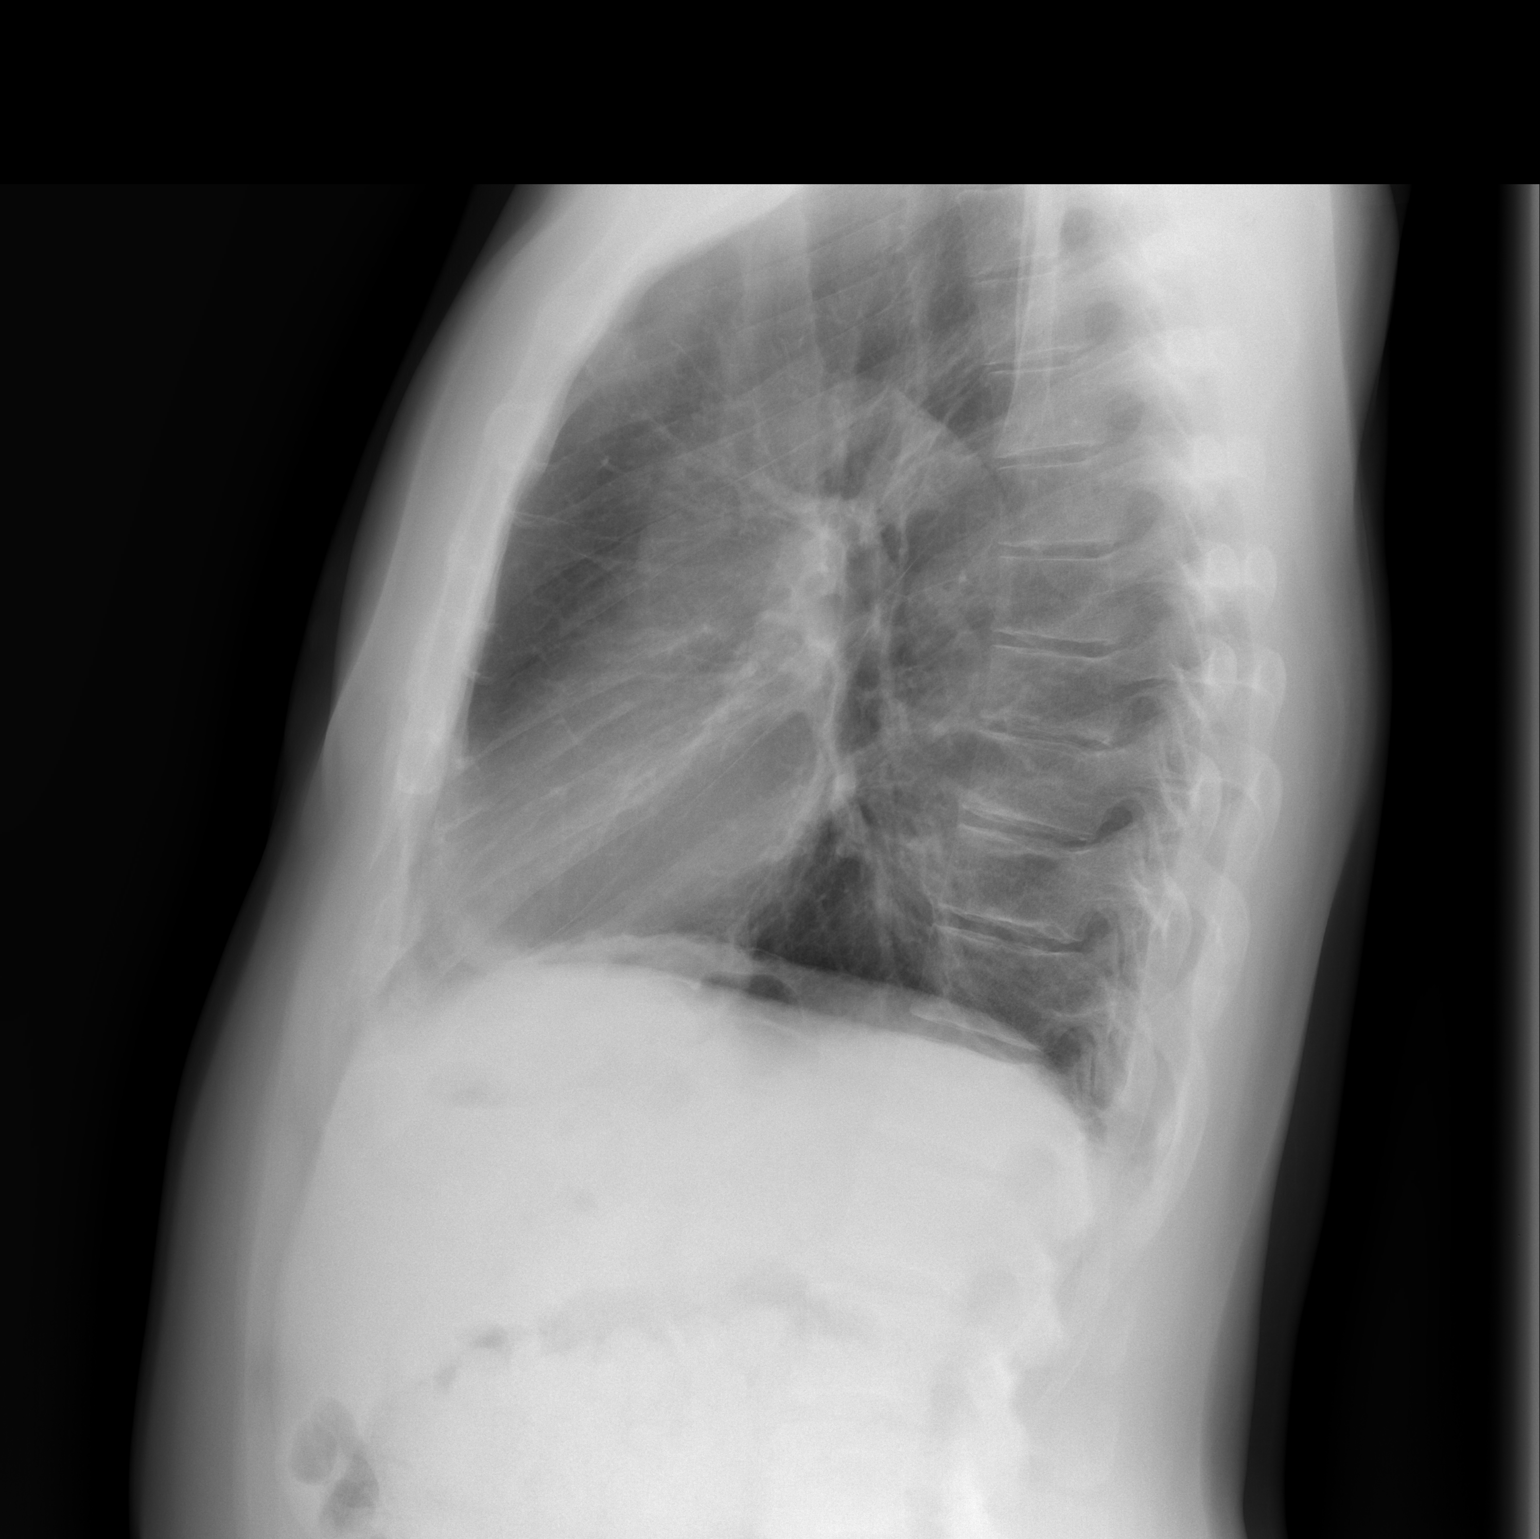

[2 of 2 positions shown; findings below may reference images not displayed]

FINDINGS: Cardiomediastinal silhouette is stable. No acute infiltrate or
pleural effusion. No pulmonary edema. Bony thorax is unremarkable.
IMPRESSION: No active cardiopulmonary disease.

## 2017-10-27 DIAGNOSIS — C61 Malignant neoplasm of prostate: Secondary | ICD-10-CM | POA: Diagnosis not present

## 2017-10-27 DIAGNOSIS — N5201 Erectile dysfunction due to arterial insufficiency: Secondary | ICD-10-CM | POA: Diagnosis not present

## 2018-01-16 DIAGNOSIS — Z23 Encounter for immunization: Secondary | ICD-10-CM | POA: Diagnosis not present

## 2018-01-16 DIAGNOSIS — I10 Essential (primary) hypertension: Secondary | ICD-10-CM | POA: Diagnosis not present

## 2018-01-16 DIAGNOSIS — G4700 Insomnia, unspecified: Secondary | ICD-10-CM | POA: Diagnosis not present

## 2018-01-16 DIAGNOSIS — G8929 Other chronic pain: Secondary | ICD-10-CM | POA: Diagnosis not present

## 2018-04-27 DIAGNOSIS — C61 Malignant neoplasm of prostate: Secondary | ICD-10-CM | POA: Diagnosis not present

## 2018-05-04 DIAGNOSIS — N5201 Erectile dysfunction due to arterial insufficiency: Secondary | ICD-10-CM | POA: Diagnosis not present

## 2018-05-04 DIAGNOSIS — C61 Malignant neoplasm of prostate: Secondary | ICD-10-CM | POA: Diagnosis not present

## 2018-07-11 ENCOUNTER — Emergency Department (HOSPITAL_COMMUNITY)
Admission: EM | Admit: 2018-07-11 | Discharge: 2018-07-11 | Disposition: A | Discharge status: Home / Self Care | Payer: Medicare PPO | Attending: Emergency Medicine | Admitting: Emergency Medicine

## 2018-07-11 ENCOUNTER — Encounter (HOSPITAL_COMMUNITY): Payer: Self-pay

## 2018-07-11 DIAGNOSIS — M79642 Pain in left hand: Secondary | ICD-10-CM | POA: Diagnosis not present

## 2018-07-11 DIAGNOSIS — M79602 Pain in left arm: Secondary | ICD-10-CM | POA: Insufficient documentation

## 2018-07-11 DIAGNOSIS — Z79899 Other long term (current) drug therapy: Secondary | ICD-10-CM | POA: Insufficient documentation

## 2018-07-11 DIAGNOSIS — M25519 Pain in unspecified shoulder: Secondary | ICD-10-CM | POA: Diagnosis not present

## 2018-07-11 DIAGNOSIS — M7918 Myalgia, other site: Secondary | ICD-10-CM

## 2018-07-11 DIAGNOSIS — M79641 Pain in right hand: Secondary | ICD-10-CM | POA: Diagnosis not present

## 2018-07-11 DIAGNOSIS — R109 Unspecified abdominal pain: Secondary | ICD-10-CM | POA: Diagnosis not present

## 2018-07-11 DIAGNOSIS — Y939 Activity, unspecified: Secondary | ICD-10-CM | POA: Insufficient documentation

## 2018-07-11 DIAGNOSIS — Z8546 Personal history of malignant neoplasm of prostate: Secondary | ICD-10-CM | POA: Insufficient documentation

## 2018-07-11 DIAGNOSIS — Y999 Unspecified external cause status: Secondary | ICD-10-CM | POA: Insufficient documentation

## 2018-07-11 DIAGNOSIS — M79603 Pain in arm, unspecified: Secondary | ICD-10-CM | POA: Diagnosis not present

## 2018-07-11 DIAGNOSIS — R42 Dizziness and giddiness: Secondary | ICD-10-CM | POA: Diagnosis not present

## 2018-07-11 DIAGNOSIS — R52 Pain, unspecified: Secondary | ICD-10-CM | POA: Diagnosis not present

## 2018-07-11 DIAGNOSIS — I10 Essential (primary) hypertension: Secondary | ICD-10-CM | POA: Diagnosis not present

## 2018-07-11 MED ORDER — METHOCARBAMOL 500 MG PO TABS: 500.0000 mg | ORAL_TABLET | Freq: Three times a day (TID) | ORAL | 0 refills | Status: DC | PRN
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
## 2018-07-11 NOTE — ED Notes (Signed)
Patient ambulated without assistance.

## 2018-07-18 DIAGNOSIS — M792 Neuralgia and neuritis, unspecified: Secondary | ICD-10-CM | POA: Diagnosis not present

## 2018-07-18 DIAGNOSIS — Z1159 Encounter for screening for other viral diseases: Secondary | ICD-10-CM | POA: Diagnosis not present

## 2018-07-18 DIAGNOSIS — G8929 Other chronic pain: Secondary | ICD-10-CM | POA: Diagnosis not present

## 2018-07-18 DIAGNOSIS — I10 Essential (primary) hypertension: Secondary | ICD-10-CM | POA: Diagnosis not present

## 2018-07-18 DIAGNOSIS — G4700 Insomnia, unspecified: Secondary | ICD-10-CM | POA: Diagnosis not present

## 2018-08-03 DIAGNOSIS — M79609 Pain in unspecified limb: Secondary | ICD-10-CM | POA: Diagnosis not present

## 2018-10-10 DIAGNOSIS — M79642 Pain in left hand: Secondary | ICD-10-CM | POA: Diagnosis not present

## 2018-10-10 DIAGNOSIS — G5602 Carpal tunnel syndrome, left upper limb: Secondary | ICD-10-CM | POA: Diagnosis not present

## 2018-11-21 DIAGNOSIS — R531 Weakness: Secondary | ICD-10-CM | POA: Diagnosis not present

## 2018-11-21 DIAGNOSIS — M25532 Pain in left wrist: Secondary | ICD-10-CM | POA: Diagnosis not present

## 2018-11-21 DIAGNOSIS — R200 Anesthesia of skin: Secondary | ICD-10-CM | POA: Diagnosis not present

## 2018-11-28 DIAGNOSIS — S63631A Sprain of interphalangeal joint of left index finger, initial encounter: Secondary | ICD-10-CM | POA: Diagnosis not present

## 2018-11-28 DIAGNOSIS — C61 Malignant neoplasm of prostate: Secondary | ICD-10-CM | POA: Diagnosis not present

## 2018-11-28 DIAGNOSIS — M24541 Contracture, right hand: Secondary | ICD-10-CM | POA: Diagnosis not present

## 2018-11-28 DIAGNOSIS — M79645 Pain in left finger(s): Secondary | ICD-10-CM | POA: Diagnosis not present

## 2018-11-28 DIAGNOSIS — M19042 Primary osteoarthritis, left hand: Secondary | ICD-10-CM | POA: Diagnosis not present

## 2018-11-28 DIAGNOSIS — G5602 Carpal tunnel syndrome, left upper limb: Secondary | ICD-10-CM | POA: Diagnosis not present

## 2018-12-07 DIAGNOSIS — N5201 Erectile dysfunction due to arterial insufficiency: Secondary | ICD-10-CM | POA: Diagnosis not present

## 2018-12-07 DIAGNOSIS — N393 Stress incontinence (female) (male): Secondary | ICD-10-CM | POA: Diagnosis not present

## 2018-12-07 DIAGNOSIS — C61 Malignant neoplasm of prostate: Secondary | ICD-10-CM | POA: Diagnosis not present

## 2018-12-10 DIAGNOSIS — S63631D Sprain of interphalangeal joint of left index finger, subsequent encounter: Secondary | ICD-10-CM | POA: Diagnosis not present

## 2018-12-10 DIAGNOSIS — M79645 Pain in left finger(s): Secondary | ICD-10-CM | POA: Diagnosis not present

## 2018-12-10 DIAGNOSIS — M25642 Stiffness of left hand, not elsewhere classified: Secondary | ICD-10-CM | POA: Diagnosis not present

## 2018-12-10 DIAGNOSIS — M25641 Stiffness of right hand, not elsewhere classified: Secondary | ICD-10-CM | POA: Diagnosis not present

## 2018-12-10 DIAGNOSIS — R29898 Other symptoms and signs involving the musculoskeletal system: Secondary | ICD-10-CM | POA: Diagnosis not present

## 2018-12-10 DIAGNOSIS — M19042 Primary osteoarthritis, left hand: Secondary | ICD-10-CM | POA: Diagnosis not present

## 2018-12-10 DIAGNOSIS — M79644 Pain in right finger(s): Secondary | ICD-10-CM | POA: Diagnosis not present

## 2018-12-10 DIAGNOSIS — M24541 Contracture, right hand: Secondary | ICD-10-CM | POA: Diagnosis not present

## 2018-12-31 DIAGNOSIS — M24541 Contracture, right hand: Secondary | ICD-10-CM | POA: Diagnosis not present

## 2018-12-31 DIAGNOSIS — M79645 Pain in left finger(s): Secondary | ICD-10-CM | POA: Diagnosis not present

## 2018-12-31 DIAGNOSIS — R29898 Other symptoms and signs involving the musculoskeletal system: Secondary | ICD-10-CM | POA: Diagnosis not present

## 2018-12-31 DIAGNOSIS — M19042 Primary osteoarthritis, left hand: Secondary | ICD-10-CM | POA: Diagnosis not present

## 2018-12-31 DIAGNOSIS — S63631D Sprain of interphalangeal joint of left index finger, subsequent encounter: Secondary | ICD-10-CM | POA: Diagnosis not present

## 2019-01-09 DIAGNOSIS — S63631A Sprain of interphalangeal joint of left index finger, initial encounter: Secondary | ICD-10-CM | POA: Diagnosis not present

## 2019-01-09 DIAGNOSIS — G5602 Carpal tunnel syndrome, left upper limb: Secondary | ICD-10-CM | POA: Diagnosis not present

## 2019-01-15 DIAGNOSIS — M24541 Contracture, right hand: Secondary | ICD-10-CM | POA: Diagnosis not present

## 2019-01-15 DIAGNOSIS — M19042 Primary osteoarthritis, left hand: Secondary | ICD-10-CM | POA: Diagnosis not present

## 2019-01-15 DIAGNOSIS — M79644 Pain in right finger(s): Secondary | ICD-10-CM | POA: Diagnosis not present

## 2019-01-15 DIAGNOSIS — S63631D Sprain of interphalangeal joint of left index finger, subsequent encounter: Secondary | ICD-10-CM | POA: Diagnosis not present

## 2019-01-15 DIAGNOSIS — R29898 Other symptoms and signs involving the musculoskeletal system: Secondary | ICD-10-CM | POA: Diagnosis not present

## 2019-01-15 DIAGNOSIS — M25641 Stiffness of right hand, not elsewhere classified: Secondary | ICD-10-CM | POA: Diagnosis not present

## 2019-01-15 DIAGNOSIS — M25642 Stiffness of left hand, not elsewhere classified: Secondary | ICD-10-CM | POA: Diagnosis not present

## 2019-01-15 DIAGNOSIS — M79645 Pain in left finger(s): Secondary | ICD-10-CM | POA: Diagnosis not present

## 2019-01-18 DIAGNOSIS — G8929 Other chronic pain: Secondary | ICD-10-CM | POA: Diagnosis not present

## 2019-01-18 DIAGNOSIS — Z23 Encounter for immunization: Secondary | ICD-10-CM | POA: Diagnosis not present

## 2019-01-18 DIAGNOSIS — J309 Allergic rhinitis, unspecified: Secondary | ICD-10-CM | POA: Diagnosis not present

## 2019-01-18 DIAGNOSIS — G4700 Insomnia, unspecified: Secondary | ICD-10-CM | POA: Diagnosis not present

## 2019-01-18 DIAGNOSIS — I10 Essential (primary) hypertension: Secondary | ICD-10-CM | POA: Diagnosis not present

## 2019-05-08 DIAGNOSIS — S63631D Sprain of interphalangeal joint of left index finger, subsequent encounter: Secondary | ICD-10-CM | POA: Diagnosis not present

## 2019-05-08 DIAGNOSIS — M79632 Pain in left forearm: Secondary | ICD-10-CM | POA: Diagnosis not present

## 2019-05-08 DIAGNOSIS — G5632 Lesion of radial nerve, left upper limb: Secondary | ICD-10-CM | POA: Diagnosis not present

## 2019-06-19 DIAGNOSIS — M47812 Spondylosis without myelopathy or radiculopathy, cervical region: Secondary | ICD-10-CM | POA: Diagnosis not present

## 2019-06-19 DIAGNOSIS — G5632 Lesion of radial nerve, left upper limb: Secondary | ICD-10-CM | POA: Diagnosis not present

## 2019-06-19 DIAGNOSIS — C61 Malignant neoplasm of prostate: Secondary | ICD-10-CM | POA: Diagnosis not present

## 2019-06-25 DIAGNOSIS — N5201 Erectile dysfunction due to arterial insufficiency: Secondary | ICD-10-CM | POA: Diagnosis not present

## 2019-06-25 DIAGNOSIS — C61 Malignant neoplasm of prostate: Secondary | ICD-10-CM | POA: Diagnosis not present

## 2019-07-15 ENCOUNTER — Telehealth: Payer: Self-pay | Admitting: Radiation Oncology

## 2019-07-15 NOTE — Telephone Encounter (Signed)
Left VM to confirm appt/verify info °

## 2019-07-15 NOTE — Progress Notes (Signed)
GU Location of Tumor / Histology: recurrent prostatic adenocarcinoma  If Prostate Cancer, Gleason Score is (3 + 4) and PSA is (19.2) pre treatment.   SARA SELVIDGE was originally diagnosed with prostate cancer in December 2016. He delayed treatment multiple times. Ultimately, he had a radical prostatectomy on 12/12/2016.     Past/Anticipated interventions by urology, if any: prostate biopsy, evaluated in [facility] on 03/04/2016, radical prostatectomy, routine PSAs, referral for consideration of radiation therapy  Past/Anticipated interventions by medical oncology, if any: no  Weight changes, if any: no  Bowel/Bladder complaints, if any: IPSS 1. SHIM 1. Denies dysuria or hematuria.Reports stress incontinence that no longer requires him to wear pads. Reports rare (every few months) noctural incontinence. Reports managing constipation with Miralax, colace, prune juice and apple juice.  Nausea/Vomiting, if any: no  Pain issues, if any:  Unchanged chronic pain  SAFETY ISSUES:  Prior radiation? no  Pacemaker/ICD? no  Possible current pregnancy? no, male patient  Is the patient on methotrexate? no  Current Complaints / other details:  68 year old male. Single. No children. Father had prostate issues but never diagnosis with cancer. Patient's brother has prostate ca.

## 2019-07-16 ENCOUNTER — Encounter: Payer: Self-pay | Admitting: Medical Oncology

## 2019-07-16 ENCOUNTER — Ambulatory Visit: Payer: No Typology Code available for payment source | Admitting: Radiation Oncology

## 2019-07-16 ENCOUNTER — Other Ambulatory Visit: Payer: Self-pay

## 2019-07-16 ENCOUNTER — Ambulatory Visit
Admission: RE | Admit: 2019-07-16 | Discharge: 2019-07-16 | Disposition: A | Discharge status: Home / Self Care | Payer: No Typology Code available for payment source | Source: Ambulatory Visit | Attending: Radiation Oncology | Admitting: Radiation Oncology

## 2019-07-16 ENCOUNTER — Encounter: Payer: Self-pay | Admitting: Radiation Oncology

## 2019-07-16 VITALS — Ht 72.0 in | Wt 197.0 lb

## 2019-07-16 DIAGNOSIS — R9721 Rising PSA following treatment for malignant neoplasm of prostate: Secondary | ICD-10-CM | POA: Diagnosis not present

## 2019-07-16 DIAGNOSIS — C61 Malignant neoplasm of prostate: Secondary | ICD-10-CM | POA: Diagnosis not present

## 2019-07-16 DIAGNOSIS — Z9079 Acquired absence of other genital organ(s): Secondary | ICD-10-CM | POA: Diagnosis not present

## 2019-07-16 NOTE — Progress Notes (Signed)
Radiation Oncology         [phone] ________________________________  Initial Outpatient Consultation - Conducted via telephone due to current COVID-19 concerns for limiting patient exposure  Name: Austin Pena MRN: [id number]  Date: 07/16/2019  DOB: [birth year]  [id number], L.Marlou Sa, MD  Raynelle Bring, MD   REFERRING PHYSICIAN: Raynelle Bring, MD  DIAGNOSIS: 68 y.o. gentleman with a rising, detectable PSA at 0.16, status post RALP with BPLND in 12/14/2016 for pT2c, N0, MX, Gleason 3+4 adenocarcinoma of the prostate with pretreatment PSA of 19.2.    ICD-10-CM   1. Malignant neoplasm of prostate ([place])  C61     HISTORY OF PRESENT ILLNESS: Austin Pena is a 68 y.o. male with a history of prostate cancer diagnosed in 11/2015 and treated with radical prostatectomy in 11/2016, now with rising, detectable PSA.   He was noted to have an elevated PSA of 14.11 by his primary care physician, Dr. Donnie Coffin.  Accordingly, he was referred for evaluation in urology by Dr. Louis Meckel,  digital rectal examination was performed at that time revealing no nodules.  The patient proceeded to transrectal ultrasound with 12 biopsies of the prostate on 12/24/15.  The prostate volume measured 52 cc.  Out of 12 core biopsies, 6 were positive.  The maximum Gleason score was 3+4, and this was seen in the left lateral base, left base, and right apex.  The patient reviewed the biopsy results with his urologist and he was referred to the multidisciplinary prostate cancer clinic on 03/04/2016. At that time, he was undecided on whether to proceed with prostate IMRT or radical prostatectomy and delayed treatment multiple times.  His PSA was noted to be further elevated at 19.20 on 08/18/2016.  He ultimately underwent BNS RALP with BPLND on 12/12/2016 with Dr. Alinda Money. Final pathology revealed pT2c, pN0, Gleason 3+4 adenocarcinoma of the prostate with a focally positive margin at the right apex and perineural  invasion but no lymph node involvement or seminal vesicle involvement.  His initial postoperative PSA was undetectable but became detectable and has been gradually rising since October 2018.  He has regained full continence at this point with only rare nocturnal incontinence.  His post-operative PSA's are as follows: 03/2017: PSA <0.015 09/2017: PSA 0.032 04/2018: PSA 0.070 11/2018: PSA 0.13 05/2019: PSA 0.16  The patient reviewed the PSA results with his urologist and he has kindly been referred today for discussion of salvage radiation treatment.   PREVIOUS RADIATION THERAPY: No  PAST MEDICAL HISTORY:  Past Medical History:  Diagnosis Date  . BBB (bundle branch block)    RBBB  . Finger dysfunction    right ring finger"torn tendon" - Dr. Dwyane Dee follows  . Hypertension   . Neuromuscular disorder (HCC)    neuropathic nerve disorder  . Prostate cancer [facility])    prostate cancer-diagnosed after biopsy      PAST SURGICAL HISTORY: Past Surgical History:  Procedure Laterality Date  . BACK SURGERY    . COLONOSCOPY    . HERNIA REPAIR Bilateral    '46 inguinal left, right -40 yrs ago  . LYMPHADENECTOMY Bilateral 12/12/2016   Procedure: PELVIC LYMPHADENECTOMY;  Surgeon: Raynelle Bring, MD;  Location[facility];  Service: Urology;  Laterality: Bilateral;  . PROSTATE BIOPSY    . ROBOT ASSISTED LAPAROSCOPIC RADICAL PROSTATECTOMY N/A 12/12/2016   Procedure: XI ROBOTIC ASSISTED LAPAROSCOPIC RADICAL PROSTATECTOMY LEVEL 2;  Surgeon: Raynelle Bring, MD;  Location[facility];  Service: Urology;  Laterality: N/A;  . ROTATOR CUFF REPAIR Bilateral  2010-[facility] right, 2014 -left    FAMILY HISTORY:  Family History  Problem Relation Age of Onset  . Cancer Father        reports his father had an enlarged prostate but, prostate ca was never confirmed.   . Prostate cancer Brother   . Breast cancer Neg Hx   . Colon cancer Neg Hx     SOCIAL HISTORY:  Social History   Socioeconomic History  .  Marital status: Single    Spouse name: Not on file  . Number of children: 0  . Years of education: Not on file  . Highest education level: Not on file  Occupational History    Comment: retired  Scientific laboratory technician  . Financial resource strain: Not on file  . Food insecurity    Worry: Not on file    Inability: Not on file  . Transportation needs    Medical: Not on file    Non-medical: Not on file  Tobacco Use  . Smoking status: Never Smoker  . Smokeless tobacco: Never Used  Substance and Sexual Activity  . Alcohol use: No  . Drug use: No  . Sexual activity: Not Currently  Lifestyle  . Physical activity    Days per week: Not on file    Minutes per session: Not on file  . Stress: Not on file  Relationships  . Social Herbalist on phone: Not on file    Gets together: Not on file    Attends religious service: Not on file    Active member of club or organization: Not on file    Attends meetings of clubs or organizations: Not on file    Relationship status: Not on file  . Intimate partner violence    Fear of current or ex partner: Not on file    Emotionally abused: Not on file    Physically abused: Not on file    Forced sexual activity: Not on file  Other Topics Concern  . Not on file  Social History Narrative  . Not on file    ALLERGIES: Aspirin, Ibuprofen, and Peanut oil  MEDICATIONS:  Current Outpatient Medications  Medication Sig Dispense Refill  . amitriptyline (ELAVIL) 75 MG tablet Take 150 mg by mouth at bedtime.  1  . docusate sodium (COLACE) 100 MG capsule Take 1 capsule (100 mg total) by mouth 2 (two) times daily. 30 capsule 0  . HYDROcodone-acetaminophen (NORCO/VICODIN) 5-325 MG tablet Take 1 tablet by mouth every 6 (six) hours as needed for moderate pain.    Marland Kitchen lisinopril (PRINIVIL,ZESTRIL) 10 MG tablet Take 10 mg by mouth daily.    . methocarbamol (ROBAXIN) 500 MG tablet Take 1 tablet (500 mg total) by mouth every 8 (eight) hours as needed for muscle  spasms. 8 tablet 0  . Turmeric (QC TUMERIC COMPLEX) 500 MG CAPS Take by mouth.    . gabapentin (NEURONTIN) 300 MG capsule TK 1 C PO BID     No current facility-administered medications for this encounter.     REVIEW OF SYSTEMS:  On review of systems, the patient reports that he is doing well overall. He denies any chest pain, shortness of breath, cough, fevers, chills, night sweats, or unintended weight changes. He denies abdominal pain, nausea or vomiting. He reports constipation, managed with Miralax, Colace, prune juice, and apple juice. He reports unchanged chronic pain, but denies any new musculoskeletal or joint aches or pains. His IPSS was 1, indicating mild urinary symptoms. He denies  dysuria or hematuria. He reports occasional, small volume stress incontinence that no longer requires him to wear pads. He reports rare (every few months) nocturnal incontinence. His SHIM was 1, indicating he does have severe erectile dysfunction but this responds to Viagra. A complete review of systems is obtained and is otherwise negative.    PHYSICAL EXAM: Not performed in light of telephone consultation format.  Wt Readings from Last 3 Encounters:  07/16/19 197 lb (89.4 kg)  07/11/18 196 lb (88.9 kg)  12/12/16 200 lb (90.7 kg)   Temp Readings from Last 3 Encounters:  07/11/18 98.1 F (36.7 C) (Oral)  12/14/16 98.6 F (37 C) (Oral)  12/08/16 98.6 F (37 C)   BP Readings from Last 3 Encounters:  07/11/18 134/84  12/14/16 127/64  12/08/16 118/65   Pulse Readings from Last 3 Encounters:  07/11/18 99  12/14/16 97  12/08/16 100   Pain Assessment Pain Score: 0-No pain/10   LABORATORY DATA:  Lab Results  Component Value Date   WBC 5.4 12/08/2016   HGB 14.2 12/13/2016   HCT 41.1 12/13/2016   MCV 85.7 12/08/2016   PLT 249 12/08/2016   Lab Results  Component Value Date   NA 138 12/08/2016   K 4.6 12/08/2016   CL 103 12/08/2016   CO2 27 12/08/2016   No results found for: ALT, AST,  GGT, ALKPHOS, BILITOT   RADIOGRAPHY: No results found.    IMPRESSION/PLAN: 1. 68 y.o. gentleman with a rising, detectable PSA at 0.16, status post RALP with BPLND in 12/14/2016 for pT2c, N0, MX, Gleason 3+4 adenocarcinoma of the prostate with pretreatment PSA of 19.2.  Today we reviewed the findings and workup thus far.  We discussed the natural history of prostate cancer and reviewed the the implications of positive margins and rising, detectable PSA on the risk of prostate cancer recurrence. We reviewed some of the evidence suggesting an advantage for patients who undergo salvage radiotherapy in the setting in terms of disease control and overall survival. We discussed the recommendation to proceed with a 7.5 week course of radiation treatment directed to the prostatic fossa with regard to the logistics and delivery of external beam radiation treatment. We also outlined the risks, benefits, short and long-term effects associated with radiotherapy.  He was encouraged to ask questions that were answered to his stated satisfaction.  At the conclusion of our conversation, the patient is undecided about whether he would like to pursue salvage radiation therapy now or delay and repeat his PSA, but voiced his plan to make this decision in the next few weeks. We will share this discussion with Dr. Alinda Money and will follow up with the patient on his final decision in the near future.   The patient was encouraged to call with any additional questions or concerns in the meantime.  2. Transportation issues. The patient lives about 40 minutes away from [place] and has concerns about having transportation to and from his radiation treatments. We offered a referral to [facility] to receive treatment closer to home, but the patient prefers to receive treatment here. If he decides to pursue salvage radiation therapy, we will put him in communication with Arlan Organ, transportation coordinator for the cancer  center to help make the appropriate arrangements.  Given current concerns for patient exposure during the COVID-19 pandemic, this encounter was conducted via telephone. The patient was notified in advance and was offered a [place] meeting to allow for face to face communication but unfortunately reported that he  did not have the appropriate resources/technology to support such a visit and instead preferred to proceed with telephone consult. The patient has given verbal consent for this type of encounter. The time spent during this encounter was 65 minutes. The attendants for this meeting include Tyler Pita MD, Ashlyn Bruning PA-C, [place], patient, KANEN MOTTOLA. During the encounter, Tyler Pita MD, Ashlyn Bruning PA-C, and scribe, Rae Lips were located at [facility] Radiation Oncology Department.  Patient, Austin Pena was located at home.    Nicholos Johns, PA-C    Tyler Pita, MD  [facility] Direct Dial: [phone]  Fax: [phone] [email or web address]  Skype  [email or web address]  This document serves as a record of services personally performed by Tyler Pita, MD and Freeman Caldron, PA-C. It was created on their behalf by Rae Lips, a trained medical scribe. The creation of this record is based on the scribe's personal observations and the providers' statements to them. This document has been checked and approved by the attending providers.

## 2019-07-16 NOTE — Progress Notes (Signed)
See progress note under physician encounter. 

## 2019-07-30 ENCOUNTER — Telehealth: Payer: Self-pay | Admitting: Radiation Oncology

## 2019-07-30 NOTE — Telephone Encounter (Signed)
Received garbled voicemail message from patient. Promptly return call to inquire. No answer. Left voicemail message with my direct contact number. Awaiting return call.    Note: Patient consulted 07/16/19. Patient undecided about salvage therapy. Patient has transportation issues and lives a ways away.

## 2019-08-02 DIAGNOSIS — R1031 Right lower quadrant pain: Secondary | ICD-10-CM | POA: Diagnosis not present

## 2019-08-02 DIAGNOSIS — G4700 Insomnia, unspecified: Secondary | ICD-10-CM | POA: Diagnosis not present

## 2019-08-02 DIAGNOSIS — G8929 Other chronic pain: Secondary | ICD-10-CM | POA: Diagnosis not present

## 2019-08-02 DIAGNOSIS — I10 Essential (primary) hypertension: Secondary | ICD-10-CM | POA: Diagnosis not present

## 2019-08-05 ENCOUNTER — Telehealth: Payer: Self-pay | Admitting: Medical Oncology

## 2019-08-05 NOTE — Telephone Encounter (Signed)
Patient called left a voicemail asking for a return call. I returned his call and left him a message asking him to call me back to discuss his radiation treatments.

## 2019-08-09 ENCOUNTER — Telehealth: Payer: Self-pay | Admitting: Urology

## 2019-08-09 NOTE — Telephone Encounter (Signed)
Patient returned my call, requesting I call him back.  I tried to call him back promptly but unfortunately still no answer and this time his VM was full so I was unable to leave a message.  I will await a return call. -Avaya Mcjunkins

## 2019-08-09 NOTE — Telephone Encounter (Signed)
I left a message on the patient's home answering machine advising that I was attempting to follow up and answer any additional questions that he might have regarding starting salvage fossa XRT. Advised that he could reach me at [phone] or Cira Rue, RN at [phone].  Nicholos Johns, MMS, PA-C [place] [facility] [place]: [phone]  Fax: [phone]

## 2019-08-12 ENCOUNTER — Telehealth: Payer: Self-pay | Admitting: Medical Oncology

## 2019-08-12 NOTE — Telephone Encounter (Signed)
Spoke with Austin Pena regarding radiation treatments. He is leaning towards starting radiation but would like to see if his insurance will cover treatment at [facility]. If not, he will get treated at [facility]. He will need transportation and I will refer him to our transportation coordinator. He asked if radiation will interfere with a previous hernia repair and if it will affect a nerve conduction study he is scheduled for. I reviewed the radiation process and side effects. He voiced understanding and will call me after her speaks with [facility].

## 2019-08-12 NOTE — Telephone Encounter (Signed)
Attempted to return call to patient but unfortunately, his mailbox if full.

## 2019-08-12 NOTE — Progress Notes (Signed)
I met Austin Pena 02/2016 in the [facility]. He under went robotic prostatectomy 12/14/2016. He now presents with biochemical recurrence.He is unsure if he wants to move forward with radiation or wait for a repeat PSA. I will follow up with him in a few weeks for treatment decision.

## 2019-08-14 ENCOUNTER — Telehealth: Payer: Self-pay | Admitting: Radiation Oncology

## 2019-08-14 NOTE — Telephone Encounter (Signed)
Phoned patient twice today to follow up. Patient answered each call but both times the line disconnected after I identified myself thus no option was available to leave a message. Noted that Cira Rue, RN spoke with patient two days ago and the plan was for him to reach back out to her. Will tag team with Cira Rue and continue to try and reach patient via phone to follow up.

## 2019-08-16 ENCOUNTER — Telehealth: Payer: Self-pay | Admitting: Medical Oncology

## 2019-08-16 NOTE — Telephone Encounter (Signed)
Patient called stating he has decided to get radiation treatment here in [place].He states he will need assistance with transportation. I informed him that Enid Derry will call him with an appointment for CT simulation/planning session and he will be contacted by transportation. He asked why he needs radiation for a PSA of 0.16. I discussed with him how his PSA should be zero after the prostatectomy and now is it slowly rising, which indicates risk of recurrence. He also had a positive margin on his pathology. He voiced understanding and I asked him to call me back with questions or concerns. I notified Enid Derry of the above.

## 2019-08-21 ENCOUNTER — Telehealth: Payer: Self-pay | Admitting: *Deleted

## 2019-08-21 NOTE — Telephone Encounter (Signed)
CALLED PATIENT TO INFORM OF SIM APPT. FOR 08-27-19 @ 2 PM @ DR. MANNING'S OFFICE, AND I ALSO CALLED J. CASTILLO AND SHE WILL CALL HIM WITH TRANSPO., LVM FOR A RETURN CALL

## 2019-08-22 ENCOUNTER — Telehealth: Payer: Self-pay | Admitting: Medical Oncology

## 2019-08-22 NOTE — Telephone Encounter (Signed)
[name] left a message asking about appointment for radiation. I spoke with him, and he states he did not get message from Octa with CT simulation appointment. I gave him the date 9/2@ 2 pm for simulation and informed him, J. Costello will call him to arrange transportation. I explained CT simulation and informed him he will receive his schedule that day. He is concerned about a MD appointment that is already scheduled. I asked him to discuss with his radiation therapist when he begins treatment and they will work with him. I encouraged him to keep cell phone with him so he will be able to speak with J. Costello, when she calls about transportation. He voiced understanding of the above and I asked him to call me with concerns and question.

## 2019-08-26 ENCOUNTER — Telehealth: Payer: Self-pay | Admitting: Medical Oncology

## 2019-08-26 ENCOUNTER — Telehealth: Payer: Self-pay | Admitting: *Deleted

## 2019-08-26 NOTE — Telephone Encounter (Signed)
Left message requesting a return call to discuss CT simulation 9/1 and transportation.

## 2019-08-26 NOTE — Telephone Encounter (Signed)
CALLED PATIENT TO REMIND OF APPT. FOR 08-27-19 - ARRIVAL TIME- 1:45 PM @ [facility], LVM FOR A RETURN CALL

## 2019-08-26 NOTE — Telephone Encounter (Signed)
Spoke with patient to confirm appointment for CT simulation 9/1 at 2 pm. He spoke with Joanna-transportation coordinator this morning to arrange transportation and states he will be here around 1:45 pm. I notified Enid Derry- radiation oncology of the above.

## 2019-08-26 NOTE — Telephone Encounter (Signed)
Called patient, lvm for a return call 

## 2019-08-27 ENCOUNTER — Encounter: Payer: Self-pay | Admitting: Medical Oncology

## 2019-08-27 ENCOUNTER — Other Ambulatory Visit: Payer: Self-pay

## 2019-08-27 ENCOUNTER — Ambulatory Visit
Admission: RE | Admit: 2019-08-27 | Discharge: 2019-08-27 | Disposition: A | Discharge status: Home / Self Care | Payer: Medicare PPO | Source: Ambulatory Visit | Attending: Radiation Oncology | Admitting: Radiation Oncology

## 2019-08-27 DIAGNOSIS — C61 Malignant neoplasm of prostate: Secondary | ICD-10-CM | POA: Insufficient documentation

## 2019-08-27 DIAGNOSIS — Z510 Encounter for antineoplastic radiation therapy: Secondary | ICD-10-CM | POA: Insufficient documentation

## 2019-08-27 NOTE — Progress Notes (Signed)
  Radiation Oncology         [phone] ________________________________  Name: Austin Pena MRN: [id number]  Date: 08/27/2019  DOB: [birth year]  SIMULATION AND TREATMENT PLANNING NOTE    ICD-10-CM   1. Malignant neoplasm of prostate ([place])  C61     DIAGNOSIS:   68 y.o. gentleman with a rising, detectable PSA at 0.16, status post RALP with BPLND in 12/14/2016 for pT2c, N0, MX, Gleason 3+4 adenocarcinoma of the prostate with pretreatment PSA of 19.2.  NARRATIVE:  The patient was brought to the [facility].  Identity was confirmed.  All relevant records and images related to the planned course of therapy were reviewed.  The patient freely provided informed written consent to proceed with treatment after reviewing the details related to the planned course of therapy. The consent form was witnessed and verified by the simulation staff.  Then, the patient was set-up in a stable reproducible supine position for radiation therapy.  A vacuum lock pillow device was custom fabricated to position his legs in a reproducible immobilized position.  Then, I performed a urethrogram under sterile conditions to identify the prostatic bed.  CT images were obtained.  Surface markings were placed.  The CT images were loaded into the planning software.  Then the prostate bed target, pelvic lymph node target and avoidance structures including the rectum, bladder, bowel and hips were contoured.  Treatment planning then occurred.  The radiation prescription was entered and confirmed.  A total of one complex treatment devices were fabricated. I have requested : Intensity Modulated Radiotherapy (IMRT) is medically necessary for this case for the following reason:  Rectal sparing.Marland Kitchen  PLAN:  The patient will receive 45 Gy in 25 fractions of 1.8 Gy, followed by a boost to the prostate bed to a total dose of 68.4 Gy with 13 additional fractions of 1.8 Gy.   ________________________________  Sheral Apley  Tammi Klippel, M.D.

## 2019-09-04 DIAGNOSIS — Z510 Encounter for antineoplastic radiation therapy: Secondary | ICD-10-CM | POA: Diagnosis not present

## 2019-09-04 DIAGNOSIS — C61 Malignant neoplasm of prostate: Secondary | ICD-10-CM | POA: Diagnosis not present

## 2019-09-09 ENCOUNTER — Telehealth: Payer: Self-pay | Admitting: *Deleted

## 2019-09-09 ENCOUNTER — Ambulatory Visit: Payer: Medicare PPO

## 2019-09-09 NOTE — Telephone Encounter (Signed)
Rockwell Germany with radiation notified patient cancelling today's appointment.    "ZOE HONN [phone]).  Cancel today's appointment, No transportation today but the rest of the week is scheduled.  Di Kindle is the contact for transportation and I left a Advertising account executive for [employer] confirmed no lift available today for [employer].

## 2019-09-10 ENCOUNTER — Encounter: Payer: Self-pay | Admitting: Medical Oncology

## 2019-09-10 ENCOUNTER — Ambulatory Visit
Admission: RE | Admit: 2019-09-10 | Discharge: 2019-09-10 | Disposition: A | Discharge status: Home / Self Care | Payer: Medicare PPO | Source: Ambulatory Visit | Attending: Radiation Oncology | Admitting: Radiation Oncology

## 2019-09-10 ENCOUNTER — Other Ambulatory Visit: Payer: Self-pay

## 2019-09-10 DIAGNOSIS — C61 Malignant neoplasm of prostate: Secondary | ICD-10-CM | POA: Diagnosis not present

## 2019-09-10 DIAGNOSIS — Z510 Encounter for antineoplastic radiation therapy: Secondary | ICD-10-CM | POA: Diagnosis not present

## 2019-09-10 NOTE — Progress Notes (Signed)
Austin Pena here for first radiation treatment. He was scheduled yesterday but due to transportation he started today. He states treatment went well but felt a warm sensation in his stomach. We discussed how this may have been some anxiety, due to being the first day. He agreed it was probably anxiety. I asked him to let the therapist know if he felt nauseated or sick. He voiced understanding.

## 2019-09-11 ENCOUNTER — Other Ambulatory Visit: Payer: Self-pay

## 2019-09-11 ENCOUNTER — Ambulatory Visit
Admission: RE | Admit: 2019-09-11 | Discharge: 2019-09-11 | Disposition: A | Discharge status: Home / Self Care | Payer: Medicare PPO | Source: Ambulatory Visit | Attending: Radiation Oncology | Admitting: Radiation Oncology

## 2019-09-11 DIAGNOSIS — Z510 Encounter for antineoplastic radiation therapy: Secondary | ICD-10-CM | POA: Diagnosis not present

## 2019-09-11 DIAGNOSIS — C61 Malignant neoplasm of prostate: Secondary | ICD-10-CM | POA: Diagnosis not present

## 2019-09-12 ENCOUNTER — Ambulatory Visit
Admission: RE | Admit: 2019-09-12 | Discharge: 2019-09-12 | Disposition: A | Discharge status: Home / Self Care | Payer: Medicare PPO | Source: Ambulatory Visit | Attending: Radiation Oncology | Admitting: Radiation Oncology

## 2019-09-12 ENCOUNTER — Other Ambulatory Visit: Payer: Self-pay

## 2019-09-12 DIAGNOSIS — Z510 Encounter for antineoplastic radiation therapy: Secondary | ICD-10-CM | POA: Diagnosis not present

## 2019-09-12 DIAGNOSIS — C61 Malignant neoplasm of prostate: Secondary | ICD-10-CM | POA: Diagnosis not present

## 2019-09-13 ENCOUNTER — Other Ambulatory Visit: Payer: Self-pay

## 2019-09-13 ENCOUNTER — Ambulatory Visit
Admission: RE | Admit: 2019-09-13 | Discharge: 2019-09-13 | Disposition: A | Discharge status: Home / Self Care | Payer: Medicare PPO | Source: Ambulatory Visit | Attending: Radiation Oncology | Admitting: Radiation Oncology

## 2019-09-13 DIAGNOSIS — C61 Malignant neoplasm of prostate: Secondary | ICD-10-CM | POA: Diagnosis not present

## 2019-09-13 DIAGNOSIS — Z510 Encounter for antineoplastic radiation therapy: Secondary | ICD-10-CM | POA: Diagnosis not present

## 2019-09-16 ENCOUNTER — Ambulatory Visit
Admission: RE | Admit: 2019-09-16 | Discharge: 2019-09-16 | Disposition: A | Discharge status: Home / Self Care | Payer: Medicare PPO | Source: Ambulatory Visit | Attending: Radiation Oncology | Admitting: Radiation Oncology

## 2019-09-16 DIAGNOSIS — C61 Malignant neoplasm of prostate: Secondary | ICD-10-CM | POA: Diagnosis not present

## 2019-09-16 DIAGNOSIS — Z510 Encounter for antineoplastic radiation therapy: Secondary | ICD-10-CM | POA: Diagnosis not present

## 2019-09-17 ENCOUNTER — Other Ambulatory Visit: Payer: Self-pay

## 2019-09-17 ENCOUNTER — Ambulatory Visit
Admission: RE | Admit: 2019-09-17 | Discharge: 2019-09-17 | Disposition: A | Discharge status: Home / Self Care | Payer: Medicare PPO | Source: Ambulatory Visit | Attending: Radiation Oncology | Admitting: Radiation Oncology

## 2019-09-17 DIAGNOSIS — C61 Malignant neoplasm of prostate: Secondary | ICD-10-CM | POA: Diagnosis not present

## 2019-09-17 DIAGNOSIS — Z510 Encounter for antineoplastic radiation therapy: Secondary | ICD-10-CM | POA: Diagnosis not present

## 2019-09-18 ENCOUNTER — Ambulatory Visit
Admission: RE | Admit: 2019-09-18 | Discharge: 2019-09-18 | Disposition: A | Discharge status: Home / Self Care | Payer: Medicare PPO | Source: Ambulatory Visit | Attending: Radiation Oncology | Admitting: Radiation Oncology

## 2019-09-18 ENCOUNTER — Other Ambulatory Visit: Payer: Self-pay

## 2019-09-18 DIAGNOSIS — M19042 Primary osteoarthritis, left hand: Secondary | ICD-10-CM | POA: Diagnosis not present

## 2019-09-18 DIAGNOSIS — S63631D Sprain of interphalangeal joint of left index finger, subsequent encounter: Secondary | ICD-10-CM | POA: Diagnosis not present

## 2019-09-18 DIAGNOSIS — C61 Malignant neoplasm of prostate: Secondary | ICD-10-CM | POA: Diagnosis not present

## 2019-09-18 DIAGNOSIS — Z510 Encounter for antineoplastic radiation therapy: Secondary | ICD-10-CM | POA: Diagnosis not present

## 2019-09-19 ENCOUNTER — Ambulatory Visit
Admission: RE | Admit: 2019-09-19 | Discharge: 2019-09-19 | Disposition: A | Discharge status: Home / Self Care | Payer: Medicare PPO | Source: Ambulatory Visit | Attending: Radiation Oncology | Admitting: Radiation Oncology

## 2019-09-19 ENCOUNTER — Other Ambulatory Visit: Payer: Self-pay

## 2019-09-19 DIAGNOSIS — C61 Malignant neoplasm of prostate: Secondary | ICD-10-CM | POA: Diagnosis not present

## 2019-09-19 DIAGNOSIS — Z510 Encounter for antineoplastic radiation therapy: Secondary | ICD-10-CM | POA: Diagnosis not present

## 2019-09-20 ENCOUNTER — Ambulatory Visit
Admission: RE | Admit: 2019-09-20 | Discharge: 2019-09-20 | Disposition: A | Discharge status: Home / Self Care | Payer: Medicare PPO | Source: Ambulatory Visit | Attending: Radiation Oncology | Admitting: Radiation Oncology

## 2019-09-20 ENCOUNTER — Other Ambulatory Visit: Payer: Self-pay

## 2019-09-20 DIAGNOSIS — Z510 Encounter for antineoplastic radiation therapy: Secondary | ICD-10-CM | POA: Diagnosis not present

## 2019-09-20 DIAGNOSIS — C61 Malignant neoplasm of prostate: Secondary | ICD-10-CM | POA: Diagnosis not present

## 2019-09-23 ENCOUNTER — Ambulatory Visit: Payer: Medicare PPO

## 2019-09-24 ENCOUNTER — Ambulatory Visit
Admission: RE | Admit: 2019-09-24 | Discharge: 2019-09-24 | Disposition: A | Discharge status: Home / Self Care | Payer: Medicare PPO | Source: Ambulatory Visit | Attending: Radiation Oncology | Admitting: Radiation Oncology

## 2019-09-24 ENCOUNTER — Other Ambulatory Visit: Payer: Self-pay

## 2019-09-24 DIAGNOSIS — C61 Malignant neoplasm of prostate: Secondary | ICD-10-CM | POA: Diagnosis not present

## 2019-09-24 DIAGNOSIS — Z510 Encounter for antineoplastic radiation therapy: Secondary | ICD-10-CM | POA: Diagnosis not present

## 2019-09-25 ENCOUNTER — Other Ambulatory Visit: Payer: Self-pay

## 2019-09-25 ENCOUNTER — Ambulatory Visit
Admission: RE | Admit: 2019-09-25 | Discharge: 2019-09-25 | Disposition: A | Discharge status: Home / Self Care | Payer: Medicare PPO | Source: Ambulatory Visit | Attending: Radiation Oncology | Admitting: Radiation Oncology

## 2019-09-25 DIAGNOSIS — C61 Malignant neoplasm of prostate: Secondary | ICD-10-CM | POA: Diagnosis not present

## 2019-09-25 DIAGNOSIS — Z510 Encounter for antineoplastic radiation therapy: Secondary | ICD-10-CM | POA: Diagnosis not present

## 2019-09-26 ENCOUNTER — Other Ambulatory Visit: Payer: Self-pay

## 2019-09-26 ENCOUNTER — Ambulatory Visit
Admission: RE | Admit: 2019-09-26 | Discharge: 2019-09-26 | Disposition: A | Discharge status: Home / Self Care | Payer: Medicare PPO | Source: Ambulatory Visit | Attending: Radiation Oncology | Admitting: Radiation Oncology

## 2019-09-26 DIAGNOSIS — C61 Malignant neoplasm of prostate: Secondary | ICD-10-CM | POA: Diagnosis not present

## 2019-09-26 DIAGNOSIS — Z510 Encounter for antineoplastic radiation therapy: Secondary | ICD-10-CM | POA: Diagnosis not present

## 2019-09-27 ENCOUNTER — Ambulatory Visit
Admission: RE | Admit: 2019-09-27 | Discharge: 2019-09-27 | Disposition: A | Discharge status: Home / Self Care | Payer: Medicare PPO | Source: Ambulatory Visit | Attending: Radiation Oncology | Admitting: Radiation Oncology

## 2019-09-27 ENCOUNTER — Other Ambulatory Visit: Payer: Self-pay

## 2019-09-27 DIAGNOSIS — C61 Malignant neoplasm of prostate: Secondary | ICD-10-CM | POA: Diagnosis not present

## 2019-09-27 DIAGNOSIS — Z510 Encounter for antineoplastic radiation therapy: Secondary | ICD-10-CM | POA: Diagnosis not present

## 2019-09-30 ENCOUNTER — Ambulatory Visit: Payer: Medicare PPO

## 2019-10-01 ENCOUNTER — Ambulatory Visit
Admission: RE | Admit: 2019-10-01 | Discharge: 2019-10-01 | Disposition: A | Discharge status: Home / Self Care | Payer: Medicare PPO | Source: Ambulatory Visit | Attending: Radiation Oncology | Admitting: Radiation Oncology

## 2019-10-01 ENCOUNTER — Other Ambulatory Visit: Payer: Self-pay

## 2019-10-01 DIAGNOSIS — C61 Malignant neoplasm of prostate: Secondary | ICD-10-CM | POA: Diagnosis not present

## 2019-10-01 DIAGNOSIS — Z510 Encounter for antineoplastic radiation therapy: Secondary | ICD-10-CM | POA: Diagnosis not present

## 2019-10-02 ENCOUNTER — Ambulatory Visit
Admission: RE | Admit: 2019-10-02 | Discharge: 2019-10-02 | Disposition: A | Discharge status: Home / Self Care | Payer: Medicare PPO | Source: Ambulatory Visit | Attending: Radiation Oncology | Admitting: Radiation Oncology

## 2019-10-02 ENCOUNTER — Other Ambulatory Visit: Payer: Self-pay

## 2019-10-02 DIAGNOSIS — Z510 Encounter for antineoplastic radiation therapy: Secondary | ICD-10-CM | POA: Diagnosis not present

## 2019-10-02 DIAGNOSIS — C61 Malignant neoplasm of prostate: Secondary | ICD-10-CM | POA: Diagnosis not present

## 2019-10-03 ENCOUNTER — Ambulatory Visit
Admission: RE | Admit: 2019-10-03 | Discharge: 2019-10-03 | Disposition: A | Discharge status: Home / Self Care | Payer: Medicare PPO | Source: Ambulatory Visit | Attending: Radiation Oncology | Admitting: Radiation Oncology

## 2019-10-03 ENCOUNTER — Other Ambulatory Visit: Payer: Self-pay

## 2019-10-03 DIAGNOSIS — Z510 Encounter for antineoplastic radiation therapy: Secondary | ICD-10-CM | POA: Diagnosis not present

## 2019-10-03 DIAGNOSIS — C61 Malignant neoplasm of prostate: Secondary | ICD-10-CM | POA: Diagnosis not present

## 2019-10-04 ENCOUNTER — Ambulatory Visit: Payer: Medicare PPO

## 2019-10-07 ENCOUNTER — Other Ambulatory Visit: Payer: Self-pay

## 2019-10-07 ENCOUNTER — Ambulatory Visit
Admission: RE | Admit: 2019-10-07 | Discharge: 2019-10-07 | Disposition: A | Discharge status: Home / Self Care | Payer: Medicare PPO | Source: Ambulatory Visit | Attending: Radiation Oncology | Admitting: Radiation Oncology

## 2019-10-07 DIAGNOSIS — C61 Malignant neoplasm of prostate: Secondary | ICD-10-CM | POA: Diagnosis not present

## 2019-10-07 DIAGNOSIS — Z510 Encounter for antineoplastic radiation therapy: Secondary | ICD-10-CM | POA: Diagnosis not present

## 2019-10-08 ENCOUNTER — Ambulatory Visit
Admission: RE | Admit: 2019-10-08 | Discharge: 2019-10-08 | Disposition: A | Discharge status: Home / Self Care | Payer: Medicare PPO | Source: Ambulatory Visit | Attending: Radiation Oncology | Admitting: Radiation Oncology

## 2019-10-08 ENCOUNTER — Other Ambulatory Visit: Payer: Self-pay

## 2019-10-08 DIAGNOSIS — Z510 Encounter for antineoplastic radiation therapy: Secondary | ICD-10-CM | POA: Diagnosis not present

## 2019-10-08 DIAGNOSIS — C61 Malignant neoplasm of prostate: Secondary | ICD-10-CM | POA: Diagnosis not present

## 2019-10-09 ENCOUNTER — Other Ambulatory Visit: Payer: Self-pay

## 2019-10-09 ENCOUNTER — Ambulatory Visit
Admission: RE | Admit: 2019-10-09 | Discharge: 2019-10-09 | Disposition: A | Discharge status: Home / Self Care | Payer: Medicare PPO | Source: Ambulatory Visit | Attending: Radiation Oncology | Admitting: Radiation Oncology

## 2019-10-09 DIAGNOSIS — C61 Malignant neoplasm of prostate: Secondary | ICD-10-CM | POA: Diagnosis not present

## 2019-10-09 DIAGNOSIS — Z510 Encounter for antineoplastic radiation therapy: Secondary | ICD-10-CM | POA: Diagnosis not present

## 2019-10-10 ENCOUNTER — Ambulatory Visit
Admission: RE | Admit: 2019-10-10 | Discharge: 2019-10-10 | Disposition: A | Discharge status: Home / Self Care | Payer: Medicare PPO | Source: Ambulatory Visit | Attending: Radiation Oncology | Admitting: Radiation Oncology

## 2019-10-10 ENCOUNTER — Other Ambulatory Visit: Payer: Self-pay

## 2019-10-10 DIAGNOSIS — Z510 Encounter for antineoplastic radiation therapy: Secondary | ICD-10-CM | POA: Diagnosis not present

## 2019-10-10 DIAGNOSIS — C61 Malignant neoplasm of prostate: Secondary | ICD-10-CM | POA: Diagnosis not present

## 2019-10-11 ENCOUNTER — Other Ambulatory Visit: Payer: Self-pay

## 2019-10-11 ENCOUNTER — Ambulatory Visit
Admission: RE | Admit: 2019-10-11 | Discharge: 2019-10-11 | Disposition: A | Discharge status: Home / Self Care | Payer: Medicare PPO | Source: Ambulatory Visit | Attending: Radiation Oncology | Admitting: Radiation Oncology

## 2019-10-11 DIAGNOSIS — Z510 Encounter for antineoplastic radiation therapy: Secondary | ICD-10-CM | POA: Diagnosis not present

## 2019-10-11 DIAGNOSIS — C61 Malignant neoplasm of prostate: Secondary | ICD-10-CM | POA: Diagnosis not present

## 2019-10-14 ENCOUNTER — Ambulatory Visit: Payer: Medicare PPO

## 2019-10-15 ENCOUNTER — Ambulatory Visit: Payer: Medicare PPO

## 2019-10-15 ENCOUNTER — Other Ambulatory Visit: Payer: Self-pay

## 2019-10-15 DIAGNOSIS — C61 Malignant neoplasm of prostate: Secondary | ICD-10-CM | POA: Diagnosis not present

## 2019-10-15 DIAGNOSIS — Z510 Encounter for antineoplastic radiation therapy: Secondary | ICD-10-CM | POA: Diagnosis not present

## 2019-10-16 ENCOUNTER — Ambulatory Visit: Payer: Medicare PPO

## 2019-10-16 ENCOUNTER — Other Ambulatory Visit: Payer: Self-pay

## 2019-10-16 DIAGNOSIS — Z510 Encounter for antineoplastic radiation therapy: Secondary | ICD-10-CM | POA: Diagnosis not present

## 2019-10-16 DIAGNOSIS — C61 Malignant neoplasm of prostate: Secondary | ICD-10-CM | POA: Diagnosis not present

## 2019-10-17 ENCOUNTER — Ambulatory Visit: Payer: Medicare PPO

## 2019-10-17 ENCOUNTER — Other Ambulatory Visit: Payer: Self-pay

## 2019-10-17 DIAGNOSIS — Z510 Encounter for antineoplastic radiation therapy: Secondary | ICD-10-CM | POA: Diagnosis not present

## 2019-10-17 DIAGNOSIS — C61 Malignant neoplasm of prostate: Secondary | ICD-10-CM | POA: Diagnosis not present

## 2019-10-18 ENCOUNTER — Other Ambulatory Visit: Payer: Self-pay

## 2019-10-18 ENCOUNTER — Ambulatory Visit: Payer: Medicare PPO

## 2019-10-18 DIAGNOSIS — C61 Malignant neoplasm of prostate: Secondary | ICD-10-CM | POA: Diagnosis not present

## 2019-10-18 DIAGNOSIS — Z510 Encounter for antineoplastic radiation therapy: Secondary | ICD-10-CM | POA: Diagnosis not present

## 2019-10-21 ENCOUNTER — Other Ambulatory Visit: Payer: Self-pay

## 2019-10-21 ENCOUNTER — Ambulatory Visit: Payer: Medicare PPO

## 2019-10-21 DIAGNOSIS — Z510 Encounter for antineoplastic radiation therapy: Secondary | ICD-10-CM | POA: Diagnosis not present

## 2019-10-21 DIAGNOSIS — C61 Malignant neoplasm of prostate: Secondary | ICD-10-CM | POA: Diagnosis not present

## 2019-10-22 ENCOUNTER — Ambulatory Visit
Admission: RE | Admit: 2019-10-22 | Discharge: 2019-10-22 | Disposition: A | Discharge status: Home / Self Care | Payer: Medicare PPO | Source: Ambulatory Visit | Attending: Radiation Oncology | Admitting: Radiation Oncology

## 2019-10-22 ENCOUNTER — Other Ambulatory Visit: Payer: Self-pay

## 2019-10-22 DIAGNOSIS — C61 Malignant neoplasm of prostate: Secondary | ICD-10-CM | POA: Diagnosis not present

## 2019-10-22 DIAGNOSIS — Z510 Encounter for antineoplastic radiation therapy: Secondary | ICD-10-CM | POA: Diagnosis not present

## 2019-10-23 ENCOUNTER — Other Ambulatory Visit: Payer: Self-pay

## 2019-10-23 ENCOUNTER — Ambulatory Visit: Payer: Medicare PPO

## 2019-10-23 DIAGNOSIS — C61 Malignant neoplasm of prostate: Secondary | ICD-10-CM | POA: Diagnosis not present

## 2019-10-23 DIAGNOSIS — Z510 Encounter for antineoplastic radiation therapy: Secondary | ICD-10-CM | POA: Diagnosis not present

## 2019-10-24 ENCOUNTER — Other Ambulatory Visit: Payer: Self-pay

## 2019-10-24 ENCOUNTER — Ambulatory Visit
Admission: RE | Admit: 2019-10-24 | Discharge: 2019-10-24 | Disposition: A | Discharge status: Home / Self Care | Payer: Medicare PPO | Source: Ambulatory Visit | Attending: Radiation Oncology | Admitting: Radiation Oncology

## 2019-10-24 DIAGNOSIS — Z510 Encounter for antineoplastic radiation therapy: Secondary | ICD-10-CM | POA: Diagnosis not present

## 2019-10-24 DIAGNOSIS — C61 Malignant neoplasm of prostate: Secondary | ICD-10-CM | POA: Diagnosis not present

## 2019-10-25 ENCOUNTER — Other Ambulatory Visit: Payer: Self-pay

## 2019-10-25 ENCOUNTER — Ambulatory Visit
Admission: RE | Admit: 2019-10-25 | Discharge: 2019-10-25 | Disposition: A | Discharge status: Home / Self Care | Payer: Medicare PPO | Source: Ambulatory Visit | Attending: Radiation Oncology | Admitting: Radiation Oncology

## 2019-10-25 DIAGNOSIS — C61 Malignant neoplasm of prostate: Secondary | ICD-10-CM | POA: Diagnosis not present

## 2019-10-25 DIAGNOSIS — Z510 Encounter for antineoplastic radiation therapy: Secondary | ICD-10-CM | POA: Diagnosis not present

## 2019-10-28 ENCOUNTER — Ambulatory Visit
Admission: RE | Admit: 2019-10-28 | Discharge: 2019-10-28 | Disposition: A | Discharge status: Home / Self Care | Payer: Medicare PPO | Source: Ambulatory Visit | Attending: Radiation Oncology | Admitting: Radiation Oncology

## 2019-10-28 ENCOUNTER — Ambulatory Visit: Payer: Medicare PPO

## 2019-10-28 DIAGNOSIS — Z510 Encounter for antineoplastic radiation therapy: Secondary | ICD-10-CM | POA: Insufficient documentation

## 2019-10-28 DIAGNOSIS — C61 Malignant neoplasm of prostate: Secondary | ICD-10-CM | POA: Diagnosis not present

## 2019-10-29 ENCOUNTER — Other Ambulatory Visit: Payer: Self-pay

## 2019-10-29 ENCOUNTER — Ambulatory Visit
Admission: RE | Admit: 2019-10-29 | Discharge: 2019-10-29 | Disposition: A | Discharge status: Home / Self Care | Payer: Medicare PPO | Source: Ambulatory Visit | Attending: Radiation Oncology | Admitting: Radiation Oncology

## 2019-10-29 DIAGNOSIS — C61 Malignant neoplasm of prostate: Secondary | ICD-10-CM | POA: Diagnosis not present

## 2019-10-29 DIAGNOSIS — Z510 Encounter for antineoplastic radiation therapy: Secondary | ICD-10-CM | POA: Diagnosis not present

## 2019-10-30 ENCOUNTER — Ambulatory Visit
Admission: RE | Admit: 2019-10-30 | Discharge: 2019-10-30 | Disposition: A | Discharge status: Home / Self Care | Payer: Medicare PPO | Source: Ambulatory Visit | Attending: Radiation Oncology | Admitting: Radiation Oncology

## 2019-10-30 ENCOUNTER — Ambulatory Visit: Payer: Medicare PPO

## 2019-10-30 DIAGNOSIS — C61 Malignant neoplasm of prostate: Secondary | ICD-10-CM | POA: Diagnosis not present

## 2019-10-30 DIAGNOSIS — M19042 Primary osteoarthritis, left hand: Secondary | ICD-10-CM | POA: Diagnosis not present

## 2019-10-30 DIAGNOSIS — G5632 Lesion of radial nerve, left upper limb: Secondary | ICD-10-CM | POA: Diagnosis not present

## 2019-10-30 DIAGNOSIS — Z510 Encounter for antineoplastic radiation therapy: Secondary | ICD-10-CM | POA: Diagnosis not present

## 2019-10-31 ENCOUNTER — Ambulatory Visit: Payer: Medicare PPO

## 2019-11-01 ENCOUNTER — Other Ambulatory Visit: Payer: Self-pay

## 2019-11-01 ENCOUNTER — Ambulatory Visit: Payer: Medicare PPO

## 2019-11-01 ENCOUNTER — Ambulatory Visit
Admission: RE | Admit: 2019-11-01 | Discharge: 2019-11-01 | Disposition: A | Discharge status: Home / Self Care | Payer: Medicare PPO | Source: Ambulatory Visit | Attending: Radiation Oncology | Admitting: Radiation Oncology

## 2019-11-01 DIAGNOSIS — C61 Malignant neoplasm of prostate: Secondary | ICD-10-CM | POA: Diagnosis not present

## 2019-11-01 DIAGNOSIS — Z510 Encounter for antineoplastic radiation therapy: Secondary | ICD-10-CM | POA: Diagnosis not present

## 2019-11-04 ENCOUNTER — Ambulatory Visit: Payer: Medicare PPO

## 2019-11-04 ENCOUNTER — Other Ambulatory Visit: Payer: Self-pay

## 2019-11-04 ENCOUNTER — Ambulatory Visit
Admission: RE | Admit: 2019-11-04 | Discharge: 2019-11-04 | Disposition: A | Discharge status: Home / Self Care | Payer: Medicare PPO | Source: Ambulatory Visit | Attending: Radiation Oncology | Admitting: Radiation Oncology

## 2019-11-04 DIAGNOSIS — C61 Malignant neoplasm of prostate: Secondary | ICD-10-CM | POA: Diagnosis not present

## 2019-11-04 DIAGNOSIS — Z510 Encounter for antineoplastic radiation therapy: Secondary | ICD-10-CM | POA: Diagnosis not present

## 2019-11-05 ENCOUNTER — Ambulatory Visit: Payer: Medicare PPO

## 2019-11-05 ENCOUNTER — Ambulatory Visit
Admission: RE | Admit: 2019-11-05 | Discharge: 2019-11-05 | Disposition: A | Discharge status: Home / Self Care | Payer: Medicare PPO | Source: Ambulatory Visit | Attending: Radiation Oncology | Admitting: Radiation Oncology

## 2019-11-05 ENCOUNTER — Other Ambulatory Visit: Payer: Self-pay

## 2019-11-05 DIAGNOSIS — Z510 Encounter for antineoplastic radiation therapy: Secondary | ICD-10-CM | POA: Diagnosis not present

## 2019-11-05 DIAGNOSIS — C61 Malignant neoplasm of prostate: Secondary | ICD-10-CM | POA: Diagnosis not present

## 2019-11-06 ENCOUNTER — Ambulatory Visit: Payer: Medicare PPO

## 2019-11-06 ENCOUNTER — Ambulatory Visit
Admission: RE | Admit: 2019-11-06 | Discharge: 2019-11-06 | Disposition: A | Discharge status: Home / Self Care | Payer: Medicare PPO | Source: Ambulatory Visit | Attending: Radiation Oncology | Admitting: Radiation Oncology

## 2019-11-06 ENCOUNTER — Other Ambulatory Visit: Payer: Self-pay

## 2019-11-06 DIAGNOSIS — Z510 Encounter for antineoplastic radiation therapy: Secondary | ICD-10-CM | POA: Diagnosis not present

## 2019-11-06 DIAGNOSIS — C61 Malignant neoplasm of prostate: Secondary | ICD-10-CM | POA: Diagnosis not present

## 2019-11-07 ENCOUNTER — Ambulatory Visit: Payer: Medicare PPO

## 2019-11-07 ENCOUNTER — Encounter: Payer: Self-pay | Admitting: Radiation Oncology

## 2019-11-07 ENCOUNTER — Ambulatory Visit
Admission: RE | Admit: 2019-11-07 | Discharge: 2019-11-07 | Disposition: A | Discharge status: Home / Self Care | Payer: Medicare PPO | Source: Ambulatory Visit | Attending: Radiation Oncology | Admitting: Radiation Oncology

## 2019-11-07 ENCOUNTER — Other Ambulatory Visit: Payer: Self-pay

## 2019-11-07 ENCOUNTER — Encounter: Payer: Self-pay | Admitting: Medical Oncology

## 2019-11-07 DIAGNOSIS — Z510 Encounter for antineoplastic radiation therapy: Secondary | ICD-10-CM | POA: Diagnosis not present

## 2019-11-07 DIAGNOSIS — C61 Malignant neoplasm of prostate: Secondary | ICD-10-CM | POA: Diagnosis not present

## 2019-11-07 NOTE — Progress Notes (Signed)
Austin Pena completed radiation. He states he has tolerated treatment well. He has follow up 12/16 @ 2:30 pm, with Ashlyn, He confirmed appointment. I wished him well and asked him to call me with questions or concerns. He voiced understanding.

## 2019-11-14 DIAGNOSIS — R1032 Left lower quadrant pain: Secondary | ICD-10-CM | POA: Diagnosis not present

## 2019-12-11 ENCOUNTER — Encounter: Payer: Self-pay | Admitting: Urology

## 2019-12-11 ENCOUNTER — Ambulatory Visit
Admission: RE | Admit: 2019-12-11 | Discharge: 2019-12-11 | Disposition: A | Discharge status: Home / Self Care | Payer: Medicare PPO | Source: Ambulatory Visit | Attending: Urology | Admitting: Urology

## 2019-12-11 ENCOUNTER — Other Ambulatory Visit: Payer: Self-pay

## 2019-12-11 DIAGNOSIS — C61 Malignant neoplasm of prostate: Secondary | ICD-10-CM

## 2019-12-11 NOTE — Progress Notes (Signed)
Radiation Oncology         [phone] ________________________________  Name: Austin Pena MRN: [id number]  Date: 12/11/2019  DOB: May 09, 1951  Post Treatment Note  CC: Alroy Dust, L.Marlou Sa, MD  Raynelle Bring, MD  Diagnosis:   68 y.o. gentleman with a rising, detectable PSA at 0.16, status post RALP with BPLND in 12/14/2016 for pT2c, N0, MX, Gleason 3+4 adenocarcinoma of the prostate with pretreatment PSA of 19.2.  Interval Since Last Radiation:  5 weeks  09/10/19 - 11/07/19: The prostate fossa and pelvic nodes were treated to 45 Gy in 25 fractions of 1.8 Gy, followed by a boost to the prostate bed to a total dose of 68.4 Gy with 13 additional fractions of 1.8 Gy  Narrative:  I spoke with the patient to conduct his routine scheduled 1 month follow up visit via telephone to spare the patient unnecessary potential exposure in the healthcare setting during the current COVID-19 pandemic.  The patient was notified in advance and gave permission to proceed with this visit format. He tolerated radiation treatments relatively well with only mild urinary symptoms and modest fatigue.  He experienced occasional mild dysuria, intermittency, and nocturia 2-3x/night.  He also reported occasional loose stools/diarrhea which was managed with Imodium prn.                          On review of systems, the patient states that he is doing very well overall.  He reports gradual improvement in his LUTS, almost back down to his baseline at this point with nocturia 1-3 times per night and improvement in the frequency, urgency and force of his stream.  He specifically denies dysuria, gross hematuria, straining to void, incomplete bladder emptying or persistent incontinence.  He continues with occasional, small volume incontinence which has been present since the time of his prostatectomy and remains stable.  He reports that the diarrhea has resolved completely and denies any abdominal pain, nausea, vomiting, diarrhea  or constipation.  He reports a healthy appetite and is maintaining his weight.  Overall, he is quite pleased with his progress to date.  ALLERGIES:  is allergic to aspirin; ibuprofen; and peanut oil.  Meds: Current Outpatient Medications  Medication Sig Dispense Refill  . amitriptyline (ELAVIL) 75 MG tablet Take 150 mg by mouth at bedtime.  1  . diclofenac Sodium (VOLTAREN) 1 % GEL Apply as directed    . docusate sodium (COLACE) 100 MG capsule Take 1 capsule (100 mg total) by mouth 2 (two) times daily. 30 capsule 0  . gabapentin (NEURONTIN) 300 MG capsule TK 1 C PO BID    . HYDROcodone-acetaminophen (NORCO/VICODIN) 5-325 MG tablet Take 1 tablet by mouth every 6 (six) hours as needed for moderate pain.    Marland Kitchen lisinopril (PRINIVIL,ZESTRIL) 10 MG tablet Take 10 mg by mouth daily.    . methocarbamol (ROBAXIN) 500 MG tablet Take 1 tablet (500 mg total) by mouth every 8 (eight) hours as needed for muscle spasms. 8 tablet 0  . Turmeric (QC TUMERIC COMPLEX) 500 MG CAPS Take by mouth.     No current facility-administered medications for this encounter.    Physical Findings:  vitals were not taken for this visit.   /Unable to assess due to telephone follow-up visit format.  Lab Findings: Lab Results  Component Value Date   WBC 5.4 12/08/2016   HGB 14.2 12/13/2016   HCT 41.1 12/13/2016   MCV 85.7 12/08/2016   PLT 249 12/08/2016  Radiographic Findings: No results found.  Impression/Plan: 1. 68 y.o. gentleman with a rising, detectable PSA at 0.16, status post RALP with BPLND in 12/14/2016 for pT2c, N0, MX, Gleason 3+4 adenocarcinoma of the prostate with pretreatment PSA of 19.2. He appears to have recovered well from the effects of his recent salvage radiotherapy.  He will continue to follow up with urology for ongoing PSA determinations and has an appointment scheduled with Dr. Alinda Money on January 03, 2020. He understands what to expect with regards to PSA monitoring going forward. I will  look forward to following his response to treatment via correspondence with urology, and would be happy to continue to participate in his care if clinically indicated. I talked to the patient about what to expect in the future, including his risk for erectile dysfunction and rectal bleeding. I encouraged him to call or return to the office if he has any questions regarding his previous radiation or possible radiation side effects. He was comfortable with this plan and will follow up as needed.    Nicholos Johns, PA-C

## 2019-12-13 NOTE — Progress Notes (Signed)
  Radiation Oncology         [phone] ________________________________  Name: Austin Pena MRN: [id number]  Date: 11/07/2019  DOB: [birth year]  End of Treatment Note  Diagnosis:   68 y.o. gentleman with a rising, detectable PSA at 0.16, status post RALP with BPLND in 12/14/2016 for pT2c, N0, MX, Gleason 3+4 adenocarcinoma of the prostate with pretreatment PSA of 19.2     Indication for treatment:  Curative, Definitive Radiotherapy       Radiation treatment dates:   09/10/2019 - 11/07/2019  Site/dose:  1. The prostate fossa and pelvic lymph nodes were initially treated to 45 Gy in 25 fractions of 1.8 Gy  2. The prostate fossa only was boosted to 68.4 Gy with 13 additional fractions of 1.8 Gy   Beams/energy:  1. The prostate fossa  and pelvic lymph nodes were initially treated using VMAT intensity modulated radiotherapy delivering 6 megavolt photons. Image guidance was performed with CB-CT studies prior to each fraction. He was immobilized with a body fix lower extremity mold.  2. The prostate fossa only was boosted using VMAT intensity modulated radiotherapy delivering 6 megavolt photons. Image guidance was performed with CB-CT studies prior to each fraction. He was immobilized with a body fix lower extremity mold.  Narrative: The patient tolerated radiation treatment relatively well. He reported nocturia x3, some dysuria, mild fatigue, diarrhea controlled with imodium, some nausea, occasional urgency, and weakening stream.  Plan: The patient has completed radiation treatment. He will return to radiation oncology clinic for routine followup in one month. I advised him to call or return sooner if he has any questions or concerns related to his recovery or treatment. ________________________________  Sheral Apley. Tammi Klippel, M.D.   This document serves as a record of services personally performed by Tyler Pita, MD. It was created on his behalf by Wilburn Mylar, a trained medical  scribe. The creation of this record is based on the scribe's personal observations and the provider's statements to them. This document has been checked and approved by the attending provider.

## 2019-12-23 DIAGNOSIS — C61 Malignant neoplasm of prostate: Secondary | ICD-10-CM | POA: Diagnosis not present

## 2020-01-03 DIAGNOSIS — N5201 Erectile dysfunction due to arterial insufficiency: Secondary | ICD-10-CM | POA: Diagnosis not present

## 2020-01-03 DIAGNOSIS — C61 Malignant neoplasm of prostate: Secondary | ICD-10-CM | POA: Diagnosis not present

## 2020-01-03 DIAGNOSIS — H2513 Age-related nuclear cataract, bilateral: Secondary | ICD-10-CM | POA: Diagnosis not present

## 2020-02-03 DIAGNOSIS — M150 Primary generalized (osteo)arthritis: Secondary | ICD-10-CM | POA: Diagnosis not present

## 2020-02-03 DIAGNOSIS — G4700 Insomnia, unspecified: Secondary | ICD-10-CM | POA: Diagnosis not present

## 2020-02-03 DIAGNOSIS — G8929 Other chronic pain: Secondary | ICD-10-CM | POA: Diagnosis not present

## 2020-02-03 DIAGNOSIS — I10 Essential (primary) hypertension: Secondary | ICD-10-CM | POA: Diagnosis not present

## 2020-02-03 DIAGNOSIS — R1031 Right lower quadrant pain: Secondary | ICD-10-CM | POA: Diagnosis not present

## 2020-02-06 DIAGNOSIS — R109 Unspecified abdominal pain: Secondary | ICD-10-CM | POA: Diagnosis not present

## 2020-02-06 DIAGNOSIS — I10 Essential (primary) hypertension: Secondary | ICD-10-CM | POA: Diagnosis not present

## 2020-02-06 DIAGNOSIS — R1032 Left lower quadrant pain: Secondary | ICD-10-CM | POA: Diagnosis not present

## 2020-03-04 DIAGNOSIS — G5632 Lesion of radial nerve, left upper limb: Secondary | ICD-10-CM | POA: Diagnosis not present

## 2020-03-09 ENCOUNTER — Other Ambulatory Visit: Payer: Self-pay | Admitting: General Surgery

## 2020-03-09 DIAGNOSIS — R1032 Left lower quadrant pain: Secondary | ICD-10-CM | POA: Diagnosis not present

## 2020-03-23 ENCOUNTER — Ambulatory Visit
Admission: RE | Admit: 2020-03-23 | Discharge: 2020-03-23 | Disposition: A | Discharge status: Home / Self Care | Payer: Medicare PPO | Source: Ambulatory Visit | Attending: General Surgery | Admitting: General Surgery

## 2020-03-23 DIAGNOSIS — R1030 Lower abdominal pain, unspecified: Secondary | ICD-10-CM | POA: Diagnosis not present

## 2020-03-23 DIAGNOSIS — R1032 Left lower quadrant pain: Secondary | ICD-10-CM

## 2020-04-29 DIAGNOSIS — M79645 Pain in left finger(s): Secondary | ICD-10-CM | POA: Diagnosis not present

## 2020-04-30 DIAGNOSIS — M48061 Spinal stenosis, lumbar region without neurogenic claudication: Secondary | ICD-10-CM | POA: Diagnosis not present

## 2020-06-01 DIAGNOSIS — M79642 Pain in left hand: Secondary | ICD-10-CM | POA: Diagnosis not present

## 2020-07-01 DIAGNOSIS — C61 Malignant neoplasm of prostate: Secondary | ICD-10-CM | POA: Diagnosis not present

## 2020-07-01 DIAGNOSIS — S56912D Strain of unspecified muscles, fascia and tendons at forearm level, left arm, subsequent encounter: Secondary | ICD-10-CM | POA: Diagnosis not present

## 2020-07-01 DIAGNOSIS — M79645 Pain in left finger(s): Secondary | ICD-10-CM | POA: Diagnosis not present

## 2020-07-07 DIAGNOSIS — N5201 Erectile dysfunction due to arterial insufficiency: Secondary | ICD-10-CM | POA: Diagnosis not present

## 2020-07-07 DIAGNOSIS — R1032 Left lower quadrant pain: Secondary | ICD-10-CM | POA: Diagnosis not present

## 2020-07-07 DIAGNOSIS — C61 Malignant neoplasm of prostate: Secondary | ICD-10-CM | POA: Diagnosis not present

## 2020-08-04 DIAGNOSIS — G8929 Other chronic pain: Secondary | ICD-10-CM | POA: Diagnosis not present

## 2020-08-04 DIAGNOSIS — I10 Essential (primary) hypertension: Secondary | ICD-10-CM | POA: Diagnosis not present

## 2020-08-04 DIAGNOSIS — G4700 Insomnia, unspecified: Secondary | ICD-10-CM | POA: Diagnosis not present

## 2020-12-10 ENCOUNTER — Ambulatory Visit: Payer: No Typology Code available for payment source | Attending: Internal Medicine

## 2020-12-10 DIAGNOSIS — Z23 Encounter for immunization: Secondary | ICD-10-CM

## 2020-12-10 NOTE — Progress Notes (Signed)
   Covid-19 Vaccination Clinic  Name:  Austin Pena    MRN: [id number] DOB: 1951-02-26  12/10/2020  [name] was observed post Covid-19 immunization for 15 minutes without incident. He was provided with Vaccine Information Sheet and instruction to access the V-Safe system.   [name] was instructed to call 911 with any severe reactions post vaccine: Marland Kitchen Difficulty breathing  . Swelling of face and throat  . A fast heartbeat  . A bad rash all over body  . Dizziness and weakness   Immunizations Administered    No immunizations on file.

## 2020-12-18 ENCOUNTER — Encounter: Payer: Self-pay | Admitting: Emergency Medicine

## 2020-12-18 ENCOUNTER — Ambulatory Visit
Admission: EM | Admit: 2020-12-18 | Discharge: 2020-12-18 | Disposition: A | Discharge status: Home / Self Care | Payer: Medicare PPO | Attending: Family Medicine | Admitting: Family Medicine

## 2020-12-18 DIAGNOSIS — R350 Frequency of micturition: Secondary | ICD-10-CM | POA: Insufficient documentation

## 2020-12-18 DIAGNOSIS — R3915 Urgency of urination: Secondary | ICD-10-CM | POA: Diagnosis not present

## 2020-12-18 DIAGNOSIS — N3001 Acute cystitis with hematuria: Secondary | ICD-10-CM | POA: Diagnosis not present

## 2020-12-18 DIAGNOSIS — R300 Dysuria: Secondary | ICD-10-CM | POA: Insufficient documentation

## 2020-12-18 LAB — POCT URINALYSIS DIP (MANUAL ENTRY)
Bilirubin, UA: NEGATIVE
Glucose, UA: NEGATIVE mg/dL
Ketones, POC UA: NEGATIVE mg/dL
Nitrite, UA: NEGATIVE
Protein Ur, POC: >=300 mg/dL — AB
Spec Grav, UA: >=1.03 — AB (ref 1.010–1.025)
Urobilinogen, UA: 0.2 E.U./dL
pH, UA: 7 (ref 5.0–8.0)

## 2020-12-18 MED ORDER — PHENAZOPYRIDINE HCL 200 MG PO TABS: 200.0000 mg | ORAL_TABLET | Freq: Three times a day (TID) | ORAL | 0 refills | Status: DC

## 2020-12-18 MED ORDER — CEPHALEXIN 500 MG PO CAPS: 500.0000 mg | ORAL_CAPSULE | Freq: Two times a day (BID) | ORAL | 0 refills | Status: AC

## 2020-12-18 NOTE — ED Triage Notes (Signed)
Pain w/ urination that started this morning

## 2020-12-18 NOTE — Discharge Instructions (Signed)
I have sent in Keflex for you to take twice a day for 7 days  Follow up with this office or with primary care if symptoms are persisting.  Follow up in the ER for high fever, trouble swallowing, trouble breathing, other concerning symptoms.

## 2020-12-18 NOTE — ED Provider Notes (Signed)
[facility]   CC: UTI  SUBJECTIVE:  Austin Pena is a 69 y.o. male who complains of urinary frequency, urgency and dysuria for the past day. Patient denies a precipitating event, recent sexual encounter, excessive caffeine intake. Pain is intermittent and describes it as burning. Has not tried OTC medications without relief. Symptoms are made worse with urination. Admits to similar symptoms in the past. Denies fever, chills, nausea, vomiting, abdominal pain, flank pain, hematuria  ROS: As in HPI.  All other pertinent ROS negative.     Past Medical History:  Diagnosis Date  . BBB (bundle branch block)    RBBB  . Finger dysfunction    right ring finger"torn tendon" - Dr. Dwyane Dee follows  . Hypertension   . Neuromuscular disorder (HCC)    neuropathic nerve disorder  . Prostate cancer [facility])    prostate cancer-diagnosed after biopsy   Past Surgical History:  Procedure Laterality Date  . BACK SURGERY    . COLONOSCOPY    . HERNIA REPAIR Bilateral    '75 inguinal left, right -40 yrs ago  . LYMPHADENECTOMY Bilateral 12/12/2016   Procedure: PELVIC LYMPHADENECTOMY;  Surgeon: Raynelle Bring, MD;  Location: [facility];  Service: Urology;  Laterality: Bilateral;  . PROSTATE BIOPSY    . ROBOT ASSISTED LAPAROSCOPIC RADICAL PROSTATECTOMY N/A 12/12/2016   Procedure: XI ROBOTIC ASSISTED LAPAROSCOPIC RADICAL PROSTATECTOMY LEVEL 2;  Surgeon: Raynelle Bring, MD;  Location: [facility];  Service: Urology;  Laterality: N/A;  . ROTATOR CUFF REPAIR Bilateral    2010-[facility] right, 2014 -left   Allergies  Allergen Reactions  . Aspirin Other (See Comments)    Upset stomach.  . Ibuprofen Nausea Only  . Peanut Oil Other (See Comments)    Upset stomach   No current facility-administered medications on file prior to encounter.   Current Outpatient Medications on File Prior to Encounter  Medication Sig Dispense Refill  . amitriptyline (ELAVIL) 75 MG tablet Take 150 mg by mouth at bedtime.  1  .  diclofenac Sodium (VOLTAREN) 1 % GEL Apply as directed    . docusate sodium (COLACE) 100 MG capsule Take 1 capsule (100 mg total) by mouth 2 (two) times daily. 30 capsule 0  . gabapentin (NEURONTIN) 300 MG capsule TK 1 C PO BID    . HYDROcodone-acetaminophen (NORCO/VICODIN) 5-325 MG tablet Take 1 tablet by mouth every 6 (six) hours as needed for moderate pain.    Marland Kitchen lisinopril (PRINIVIL,ZESTRIL) 10 MG tablet Take 10 mg by mouth daily.    . methocarbamol (ROBAXIN) 500 MG tablet Take 1 tablet (500 mg total) by mouth every 8 (eight) hours as needed for muscle spasms. 8 tablet 0  . Turmeric (QC TUMERIC COMPLEX) 500 MG CAPS Take by mouth.     Social History   Socioeconomic History  . Marital status: Single    Spouse name: Not on file  . Number of children: 0  . Years of education: Not on file  . Highest education level: Not on file  Occupational History    Comment: retired  Tobacco Use  . Smoking status: Never Smoker  . Smokeless tobacco: Never Used  Vaping Use  . Vaping Use: Never used  Substance and Sexual Activity  . Alcohol use: No  . Drug use: No  . Sexual activity: Not Currently  Other Topics Concern  . Not on file  Social History Narrative  . Not on file   Social Determinants of Health   Financial Resource Strain: Not on file  Food Insecurity: Not on file  Transportation Needs: Not on file  Physical Activity: Not on file  Stress: Not on file  Social Connections: Not on file  Intimate Partner Violence: Not on file   Family History  Problem Relation Age of Onset  . Cancer Father        reports his father had an enlarged prostate but, prostate ca was never confirmed.   . Prostate cancer Brother   . Breast cancer Neg Hx   . Colon cancer Neg Hx     OBJECTIVE:  Vitals:   12/18/20 1339 12/18/20 1342  BP: (!) 171/100 (!) 161/97  Pulse: (!) 113   Resp: 16   Temp: 98.5 F (36.9 C)   TempSrc: Oral   SpO2: 96%    General appearance: AOx3 in no acute  distress HEENT: NCAT. Oropharynx clear.  Lungs: clear to auscultation bilaterally without adventitious breath sounds Heart: regular rate and rhythm. Radial pulses 2+ symmetrical bilaterally Abdomen: soft; non-distended; suprapubic tenderness; bowel sounds present; no guarding or rebound tenderness Back: no CVA tenderness Extremities: no edema; symmetrical with no gross deformities Skin: warm and dry Neurologic: Ambulates from chair to exam table without difficulty Psychological: alert and cooperative; normal mood and affect  Labs Reviewed  POCT URINALYSIS DIP (MANUAL ENTRY) - Abnormal; Notable for the following components:      Result Value   Spec Grav, UA >=1.030 (*)    Blood, UA large (*)    Protein Ur, POC >=300 (*)    Leukocytes, UA Trace (*)    All other components within normal limits  URINE CULTURE    ASSESSMENT & PLAN:  1. Acute cystitis with hematuria   2. Dysuria   3. Urinary frequency   4. Urinary urgency     Meds ordered this encounter  Medications  . cephALEXin (KEFLEX) 500 MG capsule    Sig: Take 1 capsule (500 mg total) by mouth 2 (two) times daily for 7 days.    Dispense:  14 capsule    Refill:  0    Order Specific Question:   Supervising Provider    Answer:   Chase Picket [id number]  . phenazopyridine (PYRIDIUM) 200 MG tablet    Sig: Take 1 tablet (200 mg total) by mouth 3 (three) times daily.    Dispense:  6 tablet    Refill:  0    Order Specific Question:   Supervising Provider    Answer:   Chase Picket [id number]    Urine culture sent  We will call you with abnormal results that need further treatment Push fluids and get plenty of rest Prescribed Keflex Prescribed pyridium Take antibiotic as directed and to completion Take pyridium as prescribed and as needed for symptomatic relief Follow up with PCP if symptoms persists Return here or go to ER if you have any new or worsening symptoms such as fever, worsening abdominal pain,  nausea/vomiting, flank pain  Outlined signs and symptoms indicating need for more acute intervention Patient verbalized understanding After Visit Summary given     Faustino Congress, NP 12/18/20 1405

## 2020-12-19 LAB — URINE CULTURE: Special Requests: NORMAL

## 2021-01-19 DIAGNOSIS — C61 Malignant neoplasm of prostate: Secondary | ICD-10-CM | POA: Diagnosis not present

## 2021-01-26 DIAGNOSIS — C61 Malignant neoplasm of prostate: Secondary | ICD-10-CM | POA: Diagnosis not present

## 2021-01-26 DIAGNOSIS — N5201 Erectile dysfunction due to arterial insufficiency: Secondary | ICD-10-CM | POA: Diagnosis not present

## 2021-01-26 DIAGNOSIS — N3944 Nocturnal enuresis: Secondary | ICD-10-CM | POA: Diagnosis not present

## 2021-02-04 DIAGNOSIS — I10 Essential (primary) hypertension: Secondary | ICD-10-CM | POA: Diagnosis not present

## 2021-02-04 DIAGNOSIS — Z23 Encounter for immunization: Secondary | ICD-10-CM | POA: Diagnosis not present

## 2021-02-04 DIAGNOSIS — G8929 Other chronic pain: Secondary | ICD-10-CM | POA: Diagnosis not present

## 2021-02-04 DIAGNOSIS — G4700 Insomnia, unspecified: Secondary | ICD-10-CM | POA: Diagnosis not present

## 2021-02-04 DIAGNOSIS — Z136 Encounter for screening for cardiovascular disorders: Secondary | ICD-10-CM | POA: Diagnosis not present

## 2021-02-04 DIAGNOSIS — Z13220 Encounter for screening for lipoid disorders: Secondary | ICD-10-CM | POA: Diagnosis not present

## 2021-02-04 IMAGING — CT CT ABD-PELV W/O CM
2 of 4 series · 12 of 46 positions shown, 14 images · non-contrast
Comparison: None.

CLINICAL DATA: Right groin pain, evaluate for recurrent hernia
status post hernia repair. History prostate cancer, status post
prostatectomy and XRT.

EXAM:
CT ABDOMEN AND PELVIS WITHOUT CONTRAST
TECHNIQUE: Multidetector CT imaging of the abdomen and pelvis was performed
following the standard protocol without IV contrast.

[Series 2: routine abdomen pelvis without 5.00 br40 s3 axial · axial · non-contrast · 0.55mm/px · z∈[+1311,+1731]mm · 9 of 102 slices shown, 11 images]
[im 9/102  soft-tissue]
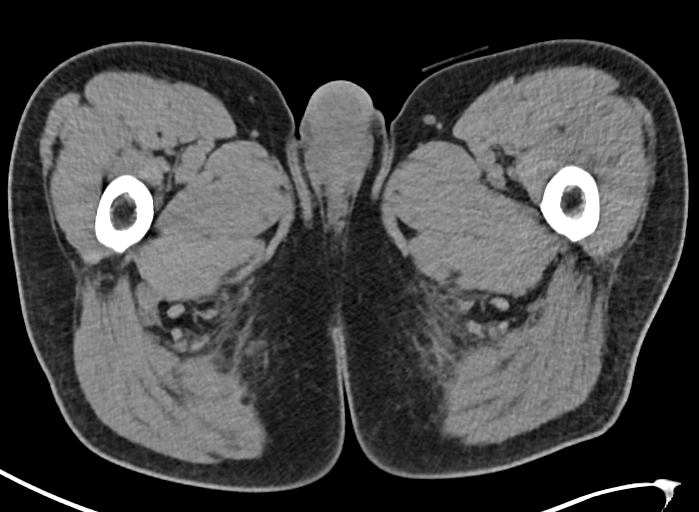
[im 9/102  bone]
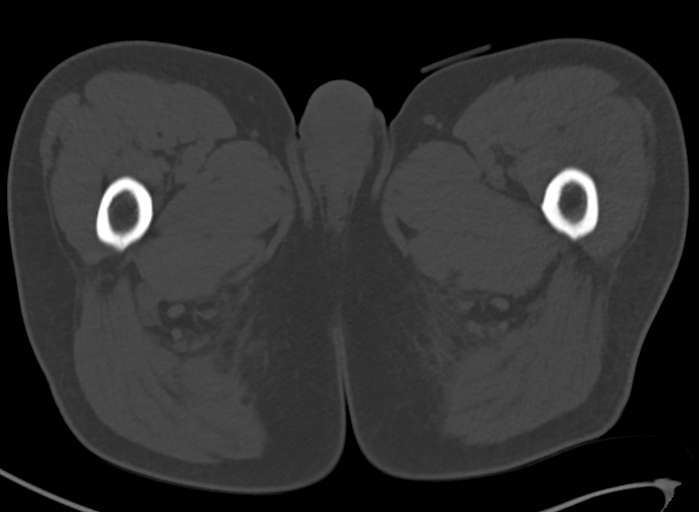
[im 17/102  soft-tissue]
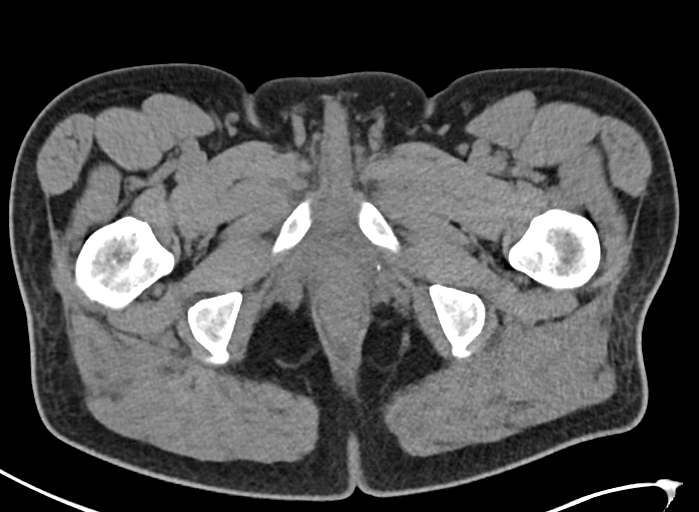
[im 30/102  soft-tissue]
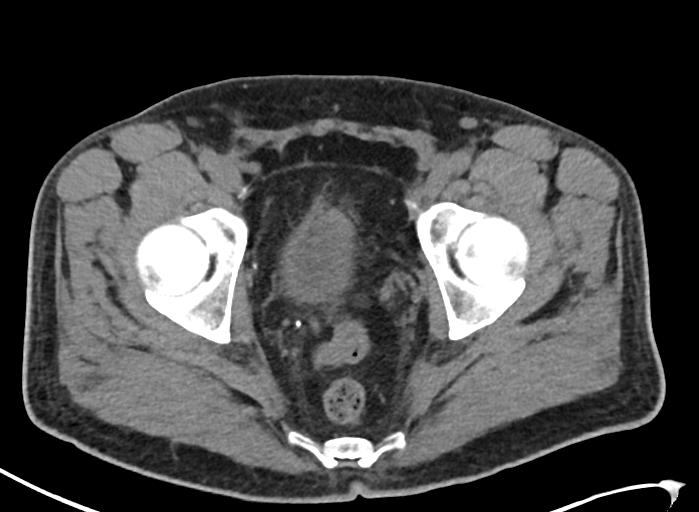
[im 38/102  soft-tissue]
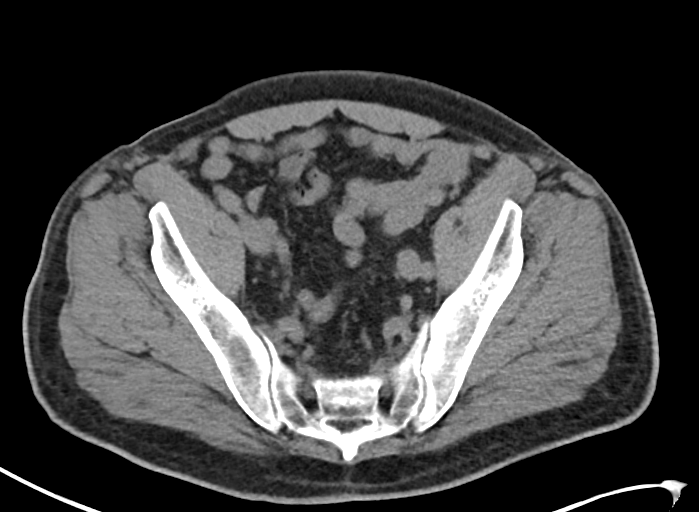
[im 51/102  soft-tissue]
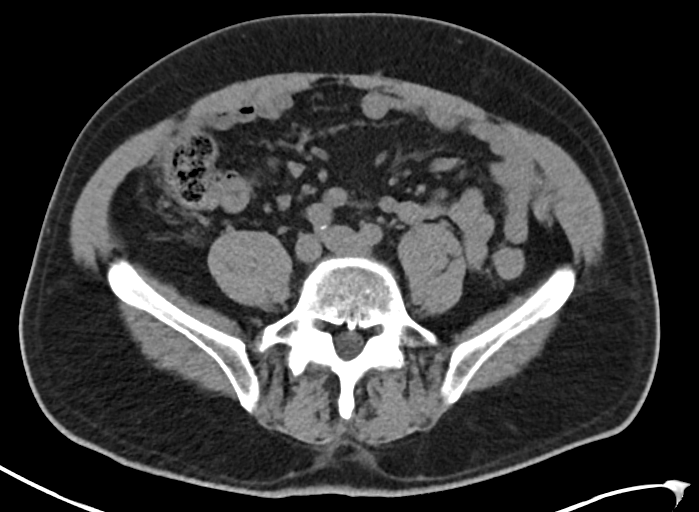
[im 64/102  soft-tissue]
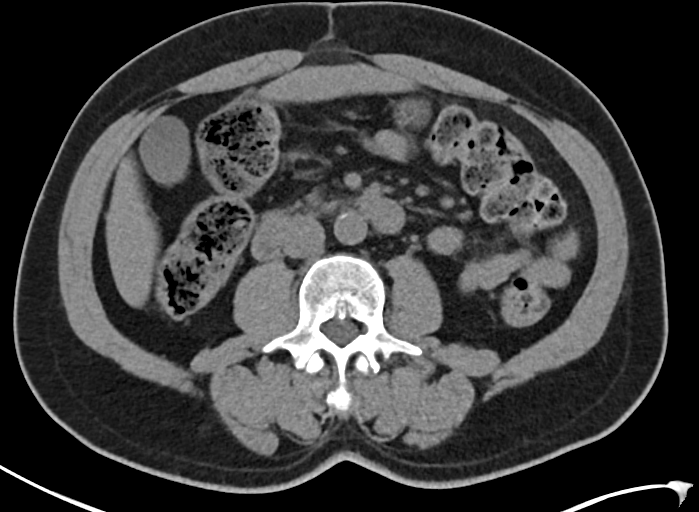
[im 72/102  soft-tissue]
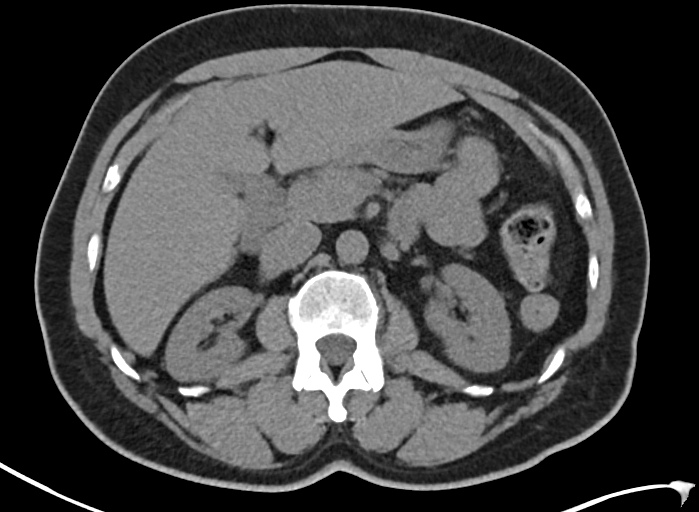
[im 85/102  soft-tissue]
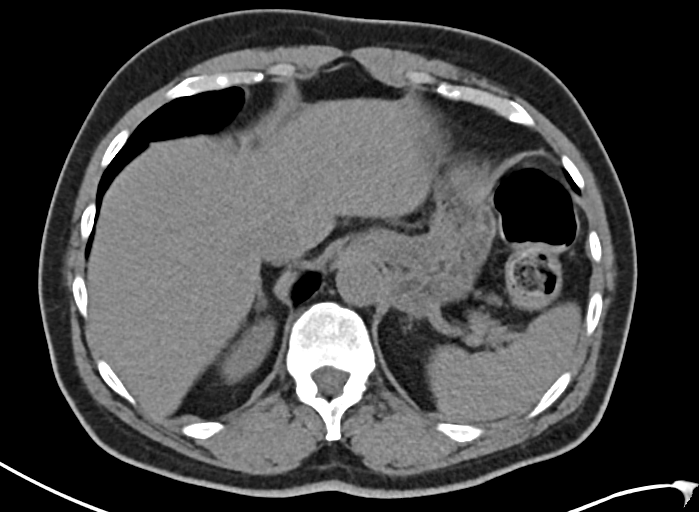
[im 93/102  soft-tissue]
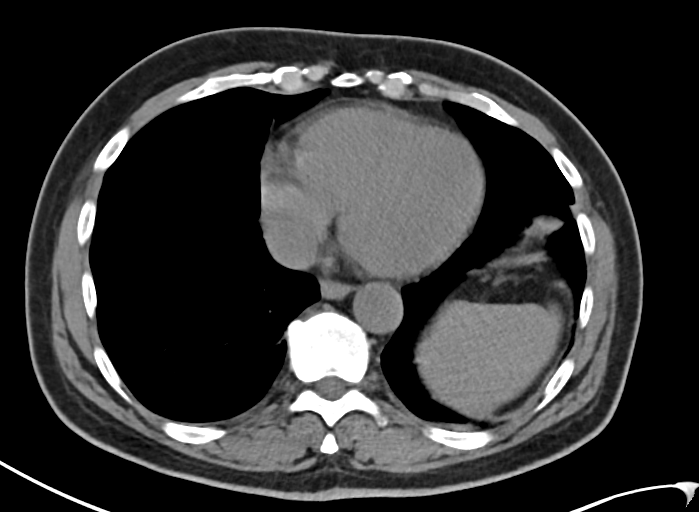
[im 93/102  bone]
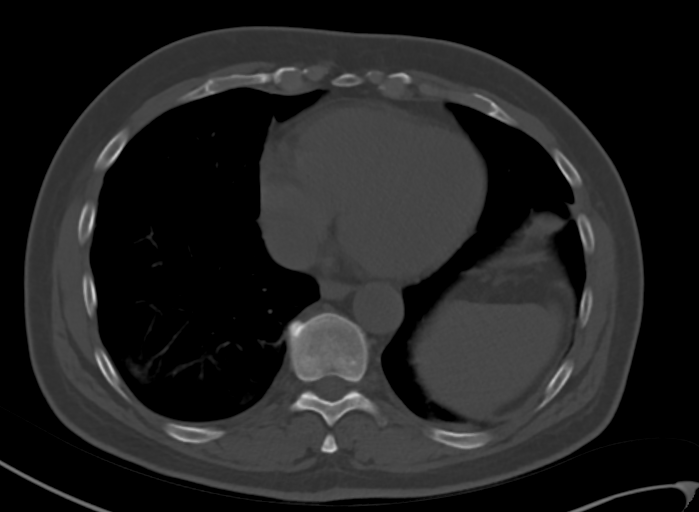

[Series 4: routine abdomen pelvis without 2.00 br40 s3 cor · coronal · non-contrast · 0.74mm/px · 3 of 139 slices shown]
[im 47/139  soft-tissue]
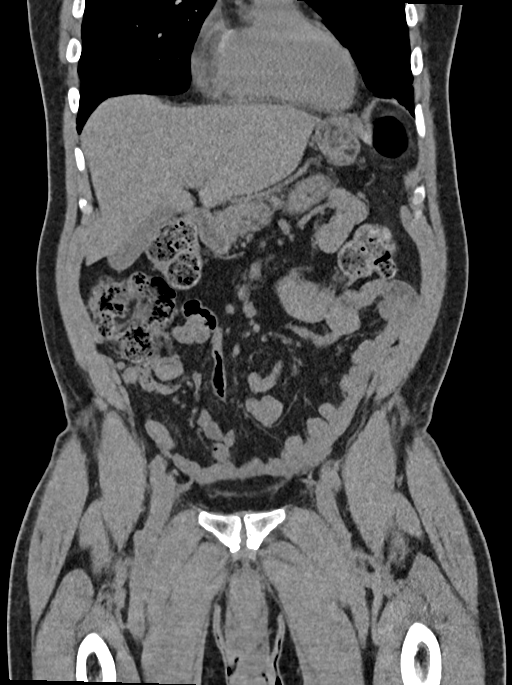
[im 62/139  soft-tissue]
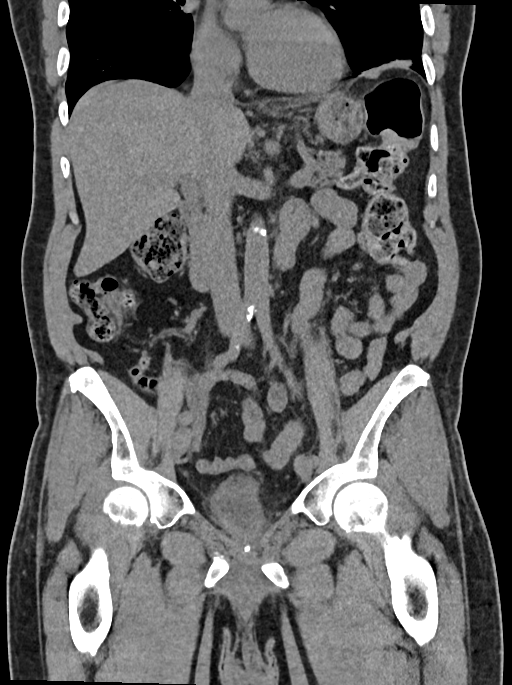
[im 77/139  soft-tissue]
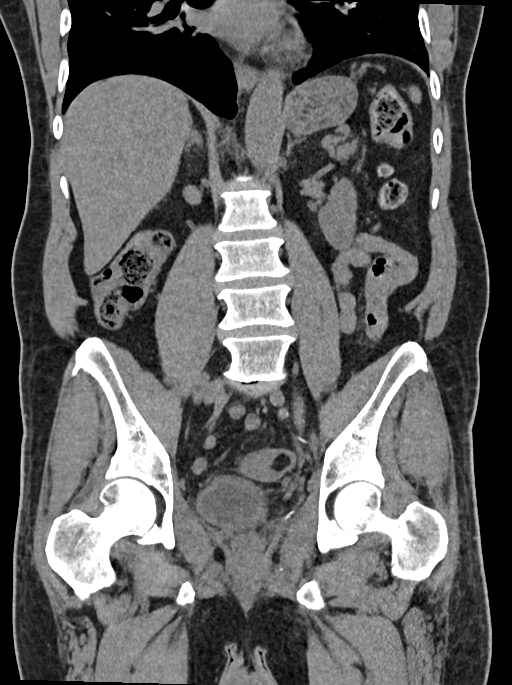

[12 of 46 positions shown; findings below may reference images not displayed]

FINDINGS: Lower chest: Trace bilateral pleural effusions. Mild
scarring/atelectasis in the bilateral lower lobes.

Hepatobiliary: Unenhanced liver is unremarkable.

Gallbladder is unremarkable. No intrahepatic or extrahepatic ductal
dilatation.

Pancreas: Within normal limits.

Spleen: Within normal limits.

Adrenals/Urinary Tract: Adrenal glands are within normal limits.

Kidneys are within normal limits. No renal, ureteral, or bladder
calculi. No hydronephrosis.

Thick-walled bladder, underdistended but with mild perivesical
stranding.

Stomach/Bowel: Stomach is within normal limits.

No evidence of bowel obstruction.

Normal appendix (series 2/image 55).

2.0 cm fatty lesion within the sigmoid colon (series 2/image 69),
possibly reflecting ingested debris, although a sigmoid lipoma is
not excluded.

Vascular/Lymphatic: No evidence of abdominal aortic aneurysm.

Atherosclerotic calcifications of the abdominal aorta and branch
vessels.

No suspicious abdominopelvic lymphadenopathy.

Reproductive: Status post prostatectomy.

Other: Postsurgical changes related to prior bilateral inguinal
hernia repair.

No evidence of residual/recurrent hernia.

Musculoskeletal: Mild degenerative changes at L5-S1.

No focal osseous lesions.
IMPRESSION: Status post prostatectomy. No evidence of recurrent or metastatic
disease.

Mildly thick-walled bladder with perivesical stranding, correlate
for radiation cystitis.

Status post bilateral inguinal hernia repair. No evidence of
residual/recurrent hernia.

Trace bilateral pleural effusions.

## 2021-08-04 DIAGNOSIS — G8929 Other chronic pain: Secondary | ICD-10-CM | POA: Diagnosis not present

## 2021-08-04 DIAGNOSIS — I10 Essential (primary) hypertension: Secondary | ICD-10-CM | POA: Diagnosis not present

## 2021-08-04 DIAGNOSIS — G4700 Insomnia, unspecified: Secondary | ICD-10-CM | POA: Diagnosis not present

## 2021-08-04 DIAGNOSIS — Z0000 Encounter for general adult medical examination without abnormal findings: Secondary | ICD-10-CM | POA: Diagnosis not present

## 2021-08-13 DIAGNOSIS — C61 Malignant neoplasm of prostate: Secondary | ICD-10-CM | POA: Diagnosis not present

## 2021-08-20 DIAGNOSIS — C61 Malignant neoplasm of prostate: Secondary | ICD-10-CM | POA: Diagnosis not present

## 2021-08-20 DIAGNOSIS — N5201 Erectile dysfunction due to arterial insufficiency: Secondary | ICD-10-CM | POA: Diagnosis not present

## 2021-08-20 DIAGNOSIS — N3944 Nocturnal enuresis: Secondary | ICD-10-CM | POA: Diagnosis not present

## 2021-10-13 DIAGNOSIS — R42 Dizziness and giddiness: Secondary | ICD-10-CM | POA: Diagnosis not present

## 2021-10-13 DIAGNOSIS — I10 Essential (primary) hypertension: Secondary | ICD-10-CM | POA: Diagnosis not present

## 2021-12-10 ENCOUNTER — Ambulatory Visit
Admission: EM | Admit: 2021-12-10 | Discharge: 2021-12-10 | Disposition: A | Discharge status: Home / Self Care | Payer: Medicare PPO | Attending: Family Medicine | Admitting: Family Medicine

## 2021-12-10 ENCOUNTER — Other Ambulatory Visit: Payer: Self-pay

## 2021-12-10 DIAGNOSIS — K0889 Other specified disorders of teeth and supporting structures: Secondary | ICD-10-CM

## 2021-12-10 DIAGNOSIS — R220 Localized swelling, mass and lump, head: Secondary | ICD-10-CM | POA: Diagnosis not present

## 2021-12-10 MED ORDER — AMOXICILLIN-POT CLAVULANATE 875-125 MG PO TABS: 1.0000 | ORAL_TABLET | Freq: Two times a day (BID) | ORAL | 0 refills | Status: DC

## 2021-12-10 MED ORDER — PREDNISONE 20 MG PO TABS: 40.0000 mg | ORAL_TABLET | Freq: Every day | ORAL | 0 refills | Status: DC

## 2021-12-10 MED ORDER — CETIRIZINE HCL 10 MG PO TABS: 10.0000 mg | ORAL_TABLET | Freq: Every day | ORAL | 0 refills | Status: AC

## 2021-12-10 NOTE — ED Triage Notes (Signed)
Patient states he woke up and his bottom lip swollen up.   He states he had a little pain in his gums because he has 2 bad teeth.   He states he may have irritated his face with Dial Soap.  Denies Fever

## 2021-12-10 NOTE — ED Provider Notes (Signed)
[facility]    CSN: [id number] Arrival date & time: 12/10/21  1459      History   Chief Complaint Chief Complaint  Patient presents with   Facial Swelling    Lip is swollen     HPI Austin Pena is a 70 y.o. male.   Patient presenting today with 1 day history of bottom lip swelling, peeling.  He states he is also having some pain in his gums on the lower jaw as he has 2 bad teeth that tend to cause him trouble.  States the only thing new is that he washed his face with Dial soap last night before bed and notes that his face and lip started stinging as soon as he started washing it with the soap.  No new medications, foods, other hygiene products.  Denies throat swelling or scratchy sensation, chest tightness, shortness of breath, nausea, vomiting.  So far not trying anything over-the-counter for symptoms.   Past Medical History:  Diagnosis Date   BBB (bundle branch block)    RBBB   Finger dysfunction    right ring finger"torn tendon" - Dr. Dwyane Dee follows   Hypertension    Neuromuscular disorder [facility])    neuropathic nerve disorder   Prostate cancer [facility])    prostate cancer-diagnosed after biopsy    Patient Active Problem List   Diagnosis Date Noted   Prostate cancer ([place]) 12/12/2016   Malignant neoplasm of prostate ([place]) 03/04/2016    Past Surgical History:  Procedure Laterality Date   BACK SURGERY     COLONOSCOPY     HERNIA REPAIR Bilateral    '13 inguinal left, right -40 yrs ago   LYMPHADENECTOMY Bilateral 12/12/2016   Procedure: PELVIC LYMPHADENECTOMY;  Surgeon: Raynelle Bring, MD;  Location: [facility];  Service: Urology;  Laterality: Bilateral;   PROSTATE BIOPSY     ROBOT ASSISTED LAPAROSCOPIC RADICAL PROSTATECTOMY N/A 12/12/2016   Procedure: XI ROBOTIC ASSISTED LAPAROSCOPIC RADICAL PROSTATECTOMY LEVEL 2;  Surgeon: Raynelle Bring, MD;  Location: [facility];  Service: Urology;  Laterality: N/A;   ROTATOR CUFF REPAIR Bilateral    2010-[facility] right, 2014  -left       Home Medications    Prior to Admission medications   Medication Sig Start Date End Date Taking? Authorizing Provider  amoxicillin-clavulanate (AUGMENTIN) 875-125 MG tablet Take 1 tablet by mouth every 12 (twelve) hours. 12/10/21  Yes Volney American, PA-C  cetirizine (ZYRTEC ALLERGY) 10 MG tablet Take 1 tablet (10 mg total) by mouth daily. 12/10/21  Yes Volney American, PA-C  predniSONE (DELTASONE) 20 MG tablet Take 2 tablets (40 mg total) by mouth daily with breakfast. 12/10/21  Yes Volney American, PA-C  amitriptyline (ELAVIL) 75 MG tablet Take 150 mg by mouth at bedtime. 10/21/16   [provider]  diclofenac Sodium (VOLTAREN) 1 % GEL Apply as directed 11/28/18   [provider]  docusate sodium (COLACE) 100 MG capsule Take 1 capsule (100 mg total) by mouth 2 (two) times daily. 12/12/16   Raynelle Bring, MD  gabapentin (NEURONTIN) 300 MG capsule TK 1 C PO BID 06/26/19   [provider]  HYDROcodone-acetaminophen (NORCO/VICODIN) 5-325 MG tablet Take 1 tablet by mouth every 6 (six) hours as needed for moderate pain.    [provider]  lisinopril (PRINIVIL,ZESTRIL) 10 MG tablet Take 10 mg by mouth daily.    [provider]  methocarbamol (ROBAXIN) 500 MG tablet Take 1 tablet (500 mg total) by mouth every 8 (eight)  hours as needed for muscle spasms. 07/11/18   Davonna Belling, MD  phenazopyridine (PYRIDIUM) 200 MG tablet Take 1 tablet (200 mg total) by mouth 3 (three) times daily. 12/18/20   Faustino Congress, NP  Turmeric (QC TUMERIC COMPLEX) 500 MG CAPS Take by mouth.    [provider]    Family History Family History  Problem Relation Age of Onset   Cancer Father        reports his father had an enlarged prostate but, prostate ca was never confirmed.    Prostate cancer Brother    Breast cancer Neg Hx    Colon cancer Neg Hx     Social History Social History   Tobacco Use   Smoking status:  Never   Smokeless tobacco: Never  Vaping Use   Vaping Use: Never used  Substance Use Topics   Alcohol use: No   Drug use: No     Allergies   Aspirin, Ibuprofen, and Peanut oil   Review of Systems Review of Systems PER HPI   Physical Exam Triage Vital Signs ED Triage Vitals [12/10/21 1654]  Enc Vitals Group     BP (!) 152/79     Pulse Rate (!) 102     Resp 18     Temp 98.6 F (37 C)     Temp Source Oral     SpO2 94 %     Weight      Height      Head Circumference      Peak Flow      Pain Score 0     Pain Loc      Pain Edu?      Excl. in [place]?    No data found.  Updated Vital Signs BP (!) 152/79 (BP Location: Right Arm)    Pulse (!) 102    Temp 98.6 F (37 C) (Oral)    Resp 18    SpO2 94%   Visual Acuity Right Eye Distance:   Left Eye Distance:   Bilateral Distance:    Right Eye Near:   Left Eye Near:    Bilateral Near:     Physical Exam Vitals and nursing note reviewed.  Constitutional:      Appearance: Normal appearance.  HENT:     Head: Atraumatic.     Mouth/Throat:     Mouth: Mucous membranes are moist.     Pharynx: Posterior oropharyngeal erythema present.     Comments: Poor dentition diffusely.  Lower jaw gingiva edematous, erythematous Diffuse lower lip swelling, dry cracking skin Eyes:     Extraocular Movements: Extraocular movements intact.     Conjunctiva/sclera: Conjunctivae normal.  Cardiovascular:     Rate and Rhythm: Normal rate and regular rhythm.  Pulmonary:     Effort: Pulmonary effort is normal.     Breath sounds: Normal breath sounds. No wheezing or rales.  Musculoskeletal:        General: Normal range of motion.     Cervical back: Normal range of motion and neck supple.  Skin:    General: Skin is warm and dry.  Neurological:     General: No focal deficit present.     Mental Status: He is oriented to person, place, and time.  Psychiatric:        Mood and Affect: Mood normal.        Thought Content: Thought content  normal.        Judgment: Judgment normal.   UC Treatments / Results  Labs (all labs ordered are listed, but only abnormal results are displayed) Labs Reviewed - No data to display  EKG   Radiology No results found.  Procedures Procedures (including critical care time)  Medications Ordered in UC Medications - No data to display  Initial Impression / Assessment and Plan / UC Course  I have reviewed the triage vital signs and the nursing notes.  Pertinent labs & imaging results that were available during my care of the patient were reviewed by me and considered in my medical decision making (see chart for details).     Unclear if allergic versus infectious, will cover for both with Augmentin, Zyrtec, prednisone.  Discussed avoiding any scented products, new foods and using Aquaphor to the lips given how chapped and dry they are.  ED for worsening symptoms, particularly difficulty breathing or swallowing.  Final Clinical Impressions(s) / UC Diagnoses   Final diagnoses:  Lip swelling  Pain, dental   Discharge Instructions   None    ED Prescriptions     Medication Sig Dispense Auth. Provider   predniSONE (DELTASONE) 20 MG tablet Take 2 tablets (40 mg total) by mouth daily with breakfast. 10 tablet Volney American, PA-C   amoxicillin-clavulanate (AUGMENTIN) 875-125 MG tablet Take 1 tablet by mouth every 12 (twelve) hours. 14 tablet Volney American, [place]   cetirizine (ZYRTEC ALLERGY) 10 MG tablet Take 1 tablet (10 mg total) by mouth daily. 14 tablet Volney American, [place]      PDMP not reviewed this encounter.   Volney American, [place] 12/10/21 1756

## 2022-02-10 DIAGNOSIS — I10 Essential (primary) hypertension: Secondary | ICD-10-CM | POA: Diagnosis not present

## 2022-02-10 DIAGNOSIS — G8929 Other chronic pain: Secondary | ICD-10-CM | POA: Diagnosis not present

## 2022-02-10 DIAGNOSIS — G4700 Insomnia, unspecified: Secondary | ICD-10-CM | POA: Diagnosis not present

## 2022-02-10 DIAGNOSIS — Z23 Encounter for immunization: Secondary | ICD-10-CM | POA: Diagnosis not present

## 2022-02-24 DIAGNOSIS — G8929 Other chronic pain: Secondary | ICD-10-CM | POA: Diagnosis not present

## 2022-02-24 DIAGNOSIS — I10 Essential (primary) hypertension: Secondary | ICD-10-CM | POA: Diagnosis not present

## 2022-02-24 DIAGNOSIS — Z6827 Body mass index (BMI) 27.0-27.9, adult: Secondary | ICD-10-CM | POA: Diagnosis not present

## 2022-02-24 DIAGNOSIS — M545 Low back pain, unspecified: Secondary | ICD-10-CM | POA: Diagnosis not present

## 2022-02-24 DIAGNOSIS — R1032 Left lower quadrant pain: Secondary | ICD-10-CM | POA: Diagnosis not present

## 2022-03-03 DIAGNOSIS — M5023 Other cervical disc displacement, cervicothoracic region: Secondary | ICD-10-CM | POA: Diagnosis not present

## 2022-03-03 DIAGNOSIS — R300 Dysuria: Secondary | ICD-10-CM | POA: Diagnosis not present

## 2022-03-03 DIAGNOSIS — M545 Low back pain, unspecified: Secondary | ICD-10-CM | POA: Diagnosis not present

## 2022-03-10 DIAGNOSIS — Z6827 Body mass index (BMI) 27.0-27.9, adult: Secondary | ICD-10-CM | POA: Diagnosis not present

## 2022-03-10 DIAGNOSIS — I10 Essential (primary) hypertension: Secondary | ICD-10-CM | POA: Diagnosis not present

## 2022-03-10 DIAGNOSIS — M5416 Radiculopathy, lumbar region: Secondary | ICD-10-CM | POA: Diagnosis not present

## 2022-03-18 DIAGNOSIS — C61 Malignant neoplasm of prostate: Secondary | ICD-10-CM | POA: Diagnosis not present

## 2022-03-25 DIAGNOSIS — N5201 Erectile dysfunction due to arterial insufficiency: Secondary | ICD-10-CM | POA: Diagnosis not present

## 2022-03-25 DIAGNOSIS — N3944 Nocturnal enuresis: Secondary | ICD-10-CM | POA: Diagnosis not present

## 2022-03-25 DIAGNOSIS — C61 Malignant neoplasm of prostate: Secondary | ICD-10-CM | POA: Diagnosis not present

## 2022-04-13 DIAGNOSIS — M5416 Radiculopathy, lumbar region: Secondary | ICD-10-CM | POA: Diagnosis not present

## 2022-05-11 DIAGNOSIS — M5416 Radiculopathy, lumbar region: Secondary | ICD-10-CM | POA: Diagnosis not present

## 2022-06-09 DIAGNOSIS — M5416 Radiculopathy, lumbar region: Secondary | ICD-10-CM | POA: Diagnosis not present

## 2022-08-07 ENCOUNTER — Other Ambulatory Visit: Payer: Self-pay

## 2022-08-07 ENCOUNTER — Emergency Department (HOSPITAL_COMMUNITY)
Admission: EM | Admit: 2022-08-07 | Discharge: 2022-08-07 | Disposition: A | Discharge status: Home / Self Care | Payer: Medicare PPO | Attending: Emergency Medicine | Admitting: Emergency Medicine

## 2022-08-07 ENCOUNTER — Encounter (HOSPITAL_COMMUNITY): Payer: Self-pay | Admitting: Emergency Medicine

## 2022-08-07 DIAGNOSIS — X58XXXA Exposure to other specified factors, initial encounter: Secondary | ICD-10-CM | POA: Diagnosis not present

## 2022-08-07 DIAGNOSIS — T162XXA Foreign body in left ear, initial encounter: Secondary | ICD-10-CM | POA: Diagnosis not present

## 2022-08-07 DIAGNOSIS — Z91010 Allergy to peanuts: Secondary | ICD-10-CM | POA: Insufficient documentation

## 2022-08-07 DIAGNOSIS — Z79899 Other long term (current) drug therapy: Secondary | ICD-10-CM | POA: Diagnosis not present

## 2022-08-07 NOTE — ED Triage Notes (Addendum)
Pt states he has the end of a set of earphones stuck in his L ear.

## 2022-08-07 NOTE — ED Notes (Signed)
Foreign body removed from left ear by Dr Alvino Chapel.

## 2022-08-07 NOTE — ED Provider Notes (Signed)
[facility] EMERGENCY DEPARTMENT Provider Note   CSN: [id number] Arrival date & time: 08/07/22  0019     History  Chief Complaint  Patient presents with   Foreign Body in Austin Pena is a 71 y.o. male.   Foreign Body in Ear  Patient presents with the end of an ear pod stuck in his left ear.  No pain.  States she could not just get it out.  No difficulty hearing.     Home Medications Prior to Admission medications   Medication Sig Start Date End Date Taking? Authorizing Provider  amitriptyline (ELAVIL) 75 MG tablet Take 150 mg by mouth at bedtime. 10/21/16   [provider]  amoxicillin-clavulanate (AUGMENTIN) 875-125 MG tablet Take 1 tablet by mouth every 12 (twelve) hours. 12/10/21   Volney American, PA-C  cetirizine (ZYRTEC ALLERGY) 10 MG tablet Take 1 tablet (10 mg total) by mouth daily. 12/10/21   Volney American, PA-C  diclofenac Sodium (VOLTAREN) 1 % GEL Apply as directed 11/28/18   [provider]  docusate sodium (COLACE) 100 MG capsule Take 1 capsule (100 mg total) by mouth 2 (two) times daily. 12/12/16   Raynelle Bring, MD  gabapentin (NEURONTIN) 300 MG capsule TK 1 C PO BID 06/26/19   [provider]  HYDROcodone-acetaminophen (NORCO/VICODIN) 5-325 MG tablet Take 1 tablet by mouth every 6 (six) hours as needed for moderate pain.    [provider]  lisinopril (PRINIVIL,ZESTRIL) 10 MG tablet Take 10 mg by mouth daily.    [provider]  methocarbamol (ROBAXIN) 500 MG tablet Take 1 tablet (500 mg total) by mouth every 8 (eight) hours as needed for muscle spasms. 07/11/18   Davonna Belling, MD  phenazopyridine (PYRIDIUM) 200 MG tablet Take 1 tablet (200 mg total) by mouth 3 (three) times daily. 12/18/20   Faustino Congress, NP  predniSONE (DELTASONE) 20 MG tablet Take 2 tablets (40 mg total) by mouth daily with breakfast. 12/10/21   Volney American, PA-C  Turmeric (QC TUMERIC COMPLEX) 500 MG  CAPS Take by mouth.    [provider]      Allergies    Aspirin, Ibuprofen, and Peanut oil    Review of Systems   Review of Systems  Physical Exam Updated Vital Signs BP (!) 153/100 (BP Location: Right Arm)   Pulse 99   Temp 97.9 F (36.6 C) (Oral)   Resp 18   Ht 6' (1.829 m)   Wt 90.7 kg   SpO2 95%   BMI 27.12 kg/m  Physical Exam Vitals reviewed.  HENT:     Ears:     Comments: Left external canal with black plastic piece from ear pod.  Removed.  TM normal after removal. Skin:    Capillary Refill: Capillary refill takes less than 2 seconds.  Neurological:     Mental Status: He is alert and oriented to person, place, and time.     ED Results / Procedures / Treatments   Labs (all labs ordered are listed, but only abnormal results are displayed) Labs Reviewed - No data to display  EKG None  Radiology No results found.  Procedures Procedures    Medications Ordered in ED Medications - No data to display  ED Course/ Medical Decision Making/ A&P                           Medical Decision Making  Patient with foreign body  stuck on external ear canal.  It was the end piece of earbuds.  Removed easily with forceps.  Discharge home        Final Clinical Impression(s) / ED Diagnoses Final diagnoses:  Ear foreign body, left, initial encounter    Rx / DC Orders ED Discharge Orders     None         Davonna Belling, MD 08/07/22 [phone]

## 2022-08-11 DIAGNOSIS — G4700 Insomnia, unspecified: Secondary | ICD-10-CM | POA: Diagnosis not present

## 2022-08-11 DIAGNOSIS — Z1211 Encounter for screening for malignant neoplasm of colon: Secondary | ICD-10-CM | POA: Diagnosis not present

## 2022-08-11 DIAGNOSIS — I10 Essential (primary) hypertension: Secondary | ICD-10-CM | POA: Diagnosis not present

## 2022-08-11 DIAGNOSIS — Z23 Encounter for immunization: Secondary | ICD-10-CM | POA: Diagnosis not present

## 2022-08-11 DIAGNOSIS — G8929 Other chronic pain: Secondary | ICD-10-CM | POA: Diagnosis not present

## 2022-08-11 DIAGNOSIS — Z0000 Encounter for general adult medical examination without abnormal findings: Secondary | ICD-10-CM | POA: Diagnosis not present

## 2022-09-21 DIAGNOSIS — Z1211 Encounter for screening for malignant neoplasm of colon: Secondary | ICD-10-CM | POA: Diagnosis not present

## 2022-10-19 DIAGNOSIS — M5416 Radiculopathy, lumbar region: Secondary | ICD-10-CM | POA: Diagnosis not present

## 2022-10-19 DIAGNOSIS — Z6828 Body mass index (BMI) 28.0-28.9, adult: Secondary | ICD-10-CM | POA: Diagnosis not present

## 2023-02-13 DIAGNOSIS — Z23 Encounter for immunization: Secondary | ICD-10-CM | POA: Diagnosis not present

## 2023-02-13 DIAGNOSIS — G8929 Other chronic pain: Secondary | ICD-10-CM | POA: Diagnosis not present

## 2023-02-13 DIAGNOSIS — I10 Essential (primary) hypertension: Secondary | ICD-10-CM | POA: Diagnosis not present

## 2023-02-13 DIAGNOSIS — G4700 Insomnia, unspecified: Secondary | ICD-10-CM | POA: Diagnosis not present

## 2023-03-13 DIAGNOSIS — C61 Malignant neoplasm of prostate: Secondary | ICD-10-CM | POA: Diagnosis not present

## 2023-03-20 DIAGNOSIS — N5201 Erectile dysfunction due to arterial insufficiency: Secondary | ICD-10-CM | POA: Diagnosis not present

## 2023-03-20 DIAGNOSIS — C61 Malignant neoplasm of prostate: Secondary | ICD-10-CM | POA: Diagnosis not present

## 2023-03-20 DIAGNOSIS — R310 Gross hematuria: Secondary | ICD-10-CM | POA: Diagnosis not present

## 2023-04-07 DIAGNOSIS — R310 Gross hematuria: Secondary | ICD-10-CM | POA: Diagnosis not present

## 2023-04-07 DIAGNOSIS — R319 Hematuria, unspecified: Secondary | ICD-10-CM | POA: Diagnosis not present

## 2023-04-07 DIAGNOSIS — K5730 Diverticulosis of large intestine without perforation or abscess without bleeding: Secondary | ICD-10-CM | POA: Diagnosis not present

## 2023-05-26 DIAGNOSIS — R059 Cough, unspecified: Secondary | ICD-10-CM | POA: Diagnosis not present

## 2023-05-26 DIAGNOSIS — Z0000 Encounter for general adult medical examination without abnormal findings: Secondary | ICD-10-CM | POA: Diagnosis not present

## 2023-05-26 DIAGNOSIS — J029 Acute pharyngitis, unspecified: Secondary | ICD-10-CM | POA: Diagnosis not present

## 2023-08-15 DIAGNOSIS — G8929 Other chronic pain: Secondary | ICD-10-CM | POA: Diagnosis not present

## 2023-08-15 DIAGNOSIS — I10 Essential (primary) hypertension: Secondary | ICD-10-CM | POA: Diagnosis not present

## 2023-08-15 DIAGNOSIS — Z1211 Encounter for screening for malignant neoplasm of colon: Secondary | ICD-10-CM | POA: Diagnosis not present

## 2023-08-15 DIAGNOSIS — Z0000 Encounter for general adult medical examination without abnormal findings: Secondary | ICD-10-CM | POA: Diagnosis not present

## 2023-08-15 DIAGNOSIS — G4700 Insomnia, unspecified: Secondary | ICD-10-CM | POA: Diagnosis not present

## 2023-09-07 DIAGNOSIS — Z1211 Encounter for screening for malignant neoplasm of colon: Secondary | ICD-10-CM | POA: Diagnosis not present

## 2024-02-15 ENCOUNTER — Encounter (HOSPITAL_COMMUNITY): Payer: Self-pay | Admitting: Emergency Medicine

## 2024-02-15 ENCOUNTER — Ambulatory Visit (HOSPITAL_COMMUNITY)
Admission: EM | Admit: 2024-02-15 | Discharge: 2024-02-15 | Disposition: A | Discharge status: Home / Self Care | Payer: Medicare PPO | Attending: Emergency Medicine | Admitting: Emergency Medicine

## 2024-02-15 DIAGNOSIS — R519 Headache, unspecified: Secondary | ICD-10-CM | POA: Diagnosis not present

## 2024-02-15 DIAGNOSIS — W5503XA Scratched by cat, initial encounter: Secondary | ICD-10-CM

## 2024-02-15 DIAGNOSIS — M542 Cervicalgia: Secondary | ICD-10-CM | POA: Diagnosis not present

## 2024-02-15 MED ORDER — ACETAMINOPHEN 325 MG PO TABS
975.0000 mg | ORAL_TABLET | Freq: Once | ORAL | Status: AC
  Administered 2024-02-15: 975 mg via ORAL

## 2024-02-15 MED ORDER — ACETAMINOPHEN 325 MG PO TABS
ORAL_TABLET | ORAL | Status: AC
  Filled 2024-02-15: qty 3

## 2024-02-15 MED ORDER — LIDOCAINE 5 % EX PTCH: 1.0000 | MEDICATED_PATCH | CUTANEOUS | 0 refills | Status: AC

## 2024-02-15 NOTE — Discharge Instructions (Signed)
You were given Tylenol in clinic today for your headache. I have prescribed lidocaine patches that you can apply to your neck as needed. Otherwise you can take prescribed hydrocodone as needed for pain.   I recommend applying Neosporin or bacitracin to the scratch on your finger. If you develop finger swelling, increased redness, or pus drainage return here for re-evaluation.   Please contact animal control regarding the stray cat that scratched you. If animal control does not obtain the animal within the next week, I would recommend you come back to seen rabies injections.

## 2024-02-15 NOTE — ED Triage Notes (Signed)
Pt reports that he has stray cats that he feeds. Last night when feeding, one of that cats scratched left index finger. Pt has red mark on finger. Pt c/o headache and neck stiffness today. Takes pain medication for his chronic pain.

## 2024-02-15 NOTE — ED Provider Notes (Signed)
[facility]    CSN: [id number] Arrival date & time: 02/15/24  1436      History   Chief Complaint Chief Complaint  Patient presents with   Headache   Torticollis    HPI Austin Pena is a 73 y.o. male.   Patient presents with cat scratch to left index finger that occurred last night while feeding a stray cat.  Denies pain, swelling, and drainage from finger.  Patient states that he called animal control a week ago regarding the stray cats and they have not come to get them.  Patient also reports mild headache and neck stiffness that began this morning.  Patient states that he takes hydrocodone and gabapentin for chronic pain, but has not taken any today.  Denies photophobia, visual disturbances, nausea, vomiting, weakness, numbness, and tingling   Headache   Past Medical History:  Diagnosis Date   BBB (bundle branch block)    RBBB   Finger dysfunction    right ring finger"torn tendon" - Dr. Lucianne Muss follows   Hypertension    Neuromuscular disorder [facility])    neuropathic nerve disorder   Prostate cancer [facility])    prostate cancer-diagnosed after biopsy    Patient Active Problem List   Diagnosis Date Noted   Prostate cancer ([facility]) 12/12/2016   Malignant neoplasm of prostate ([facility]) 03/04/2016    Past Surgical History:  Procedure Laterality Date   BACK SURGERY     COLONOSCOPY     HERNIA REPAIR Bilateral    '13 inguinal left, right -40 yrs ago   LYMPHADENECTOMY Bilateral 12/12/2016   Procedure: PELVIC LYMPHADENECTOMY;  Surgeon: Heloise Purpura, MD;  Location: [facility];  Service: Urology;  Laterality: Bilateral;   PROSTATE BIOPSY     ROBOT ASSISTED LAPAROSCOPIC RADICAL PROSTATECTOMY N/A 12/12/2016   Procedure: XI ROBOTIC ASSISTED LAPAROSCOPIC RADICAL PROSTATECTOMY LEVEL 2;  Surgeon: Heloise Purpura, MD;  Location: [facility];  Service: Urology;  Laterality: N/A;   ROTATOR CUFF REPAIR Bilateral    2010-[facility] right, 2014 -left       Home Medications    Prior to  Admission medications   Medication Sig Start Date End Date Taking? Authorizing Provider  lidocaine (LIDODERM) 5 % Place 1 patch onto the skin daily. Remove & Discard patch within 12 hours or as directed by MD 02/15/24  Yes Wynonia Lawman A, NP  amitriptyline (ELAVIL) 75 MG tablet Take 150 mg by mouth at bedtime. 10/21/16   [provider]  amoxicillin-clavulanate (AUGMENTIN) 875-125 MG tablet Take 1 tablet by mouth every 12 (twelve) hours. 12/10/21   Particia Nearing, PA-C  cetirizine (ZYRTEC ALLERGY) 10 MG tablet Take 1 tablet (10 mg total) by mouth daily. 12/10/21   Particia Nearing, PA-C  diclofenac Sodium (VOLTAREN) 1 % GEL Apply as directed 11/28/18   [provider]  docusate sodium (COLACE) 100 MG capsule Take 1 capsule (100 mg total) by mouth 2 (two) times daily. 12/12/16   Heloise Purpura, MD  gabapentin (NEURONTIN) 300 MG capsule TK 1 C PO BID 06/26/19   [provider]  HYDROcodone-acetaminophen (NORCO/VICODIN) 5-325 MG tablet Take 1 tablet by mouth every 6 (six) hours as needed for moderate pain.    [provider]  lisinopril (PRINIVIL,ZESTRIL) 10 MG tablet Take 10 mg by mouth daily.    [provider]  methocarbamol (ROBAXIN) 500 MG tablet Take 1 tablet (500 mg total) by mouth every 8 (eight) hours as needed for muscle spasms. 07/11/18   Benjiman Core, MD  phenazopyridine (PYRIDIUM) 200 MG tablet Take 1 tablet (200 mg total) by mouth 3 (three) times daily. 12/18/20   Moshe Cipro, FNP  predniSONE (DELTASONE) 20 MG tablet Take 2 tablets (40 mg total) by mouth daily with breakfast. 12/10/21   Particia Nearing, PA-C  Turmeric (QC TUMERIC COMPLEX) 500 MG CAPS Take by mouth.    [provider]    Family History Family History  Problem Relation Age of Onset   Cancer Father        reports his father had an enlarged prostate but, prostate ca was never confirmed.    Prostate cancer Brother    Breast cancer Neg  Hx    Colon cancer Neg Hx     Social History Social History   Tobacco Use   Smoking status: Never   Smokeless tobacco: Never  Vaping Use   Vaping status: Never Used  Substance Use Topics   Alcohol use: No   Drug use: No     Allergies   Aspirin, Ibuprofen, and Peanut oil   Review of Systems Review of Systems  Neurological:  Positive for headaches.   Per HPI  Physical Exam Triage Vital Signs ED Triage Vitals [02/15/24 1549]  Encounter Vitals Group     BP (!) 155/88     Systolic BP Percentile      Diastolic BP Percentile      Pulse Rate 94     Resp 18     Temp 97.8 F (36.6 C)     Temp Source Oral     SpO2 94 %     Weight      Height      Head Circumference      Peak Flow      Pain Score 7     Pain Loc      Pain Education      Exclude from Growth Chart    No data found.  Updated Vital Signs BP (!) 155/88 (BP Location: Right Arm)   Pulse 94   Temp 97.8 F (36.6 C) (Oral)   Resp 18   SpO2 94%   Visual Acuity Right Eye Distance:   Left Eye Distance:   Bilateral Distance:    Right Eye Near:   Left Eye Near:    Bilateral Near:     Physical Exam Vitals and nursing note reviewed.  Constitutional:      General: He is awake. He is not in acute distress.    Appearance: Normal appearance. He is well-developed and well-groomed. He is not ill-appearing.  Eyes:     Extraocular Movements: Extraocular movements intact.     Pupils: Pupils are equal, round, and reactive to light.  Cardiovascular:     Rate and Rhythm: Normal rate and regular rhythm.  Pulmonary:     Effort: Pulmonary effort is normal.     Breath sounds: Normal breath sounds.  Musculoskeletal:        General: Normal range of motion.     Cervical back: Tenderness present. No swelling or bony tenderness. Normal range of motion.  Skin:    General: Skin is warm and dry.     Findings: Abrasion present.     Comments: 3 cm scratch noted to left index finger without surrounding erythema,  swelling, or drainage.  Neurological:     General: No focal deficit present.     Mental Status: He is alert and oriented to person, place, and time. Mental status is at baseline.  GCS: GCS eye subscore is 4. GCS verbal subscore is 5. GCS motor subscore is 6.     Cranial Nerves: Cranial nerves 2-12 are intact.     Sensory: Sensation is intact.     Motor: Motor function is intact.     Coordination: Coordination is intact.     Gait: Gait is intact.  Psychiatric:        Behavior: Behavior is cooperative.      UC Treatments / Results  Labs (all labs ordered are listed, but only abnormal results are displayed) Labs Reviewed - No data to display  EKG   Radiology No results found.  Procedures Procedures (including critical care time)  Medications Ordered in UC Medications  acetaminophen (TYLENOL) tablet 975 mg (975 mg Oral Given 02/15/24 1625)    Initial Impression / Assessment and Plan / UC Course  I have reviewed the triage vital signs and the nursing notes.  Pertinent labs & imaging results that were available during my care of the patient were reviewed by me and considered in my medical decision making (see chart for details).     Patient presented with cat scratch to left index finger that occurred last night while feeding a stray cat.  Upon assessment there is a 3 cm scratch noted to left index finger without surrounding erythema, swelling, or drainage.  Patient declining rabies injections at this time.  Patient states he will call animal control to have cat detained and monitored for rabies.  Discussed risks of not receiving rabies coverage.  Patient understands the risks and states that he will return if animal control is unable to obtain the cat.  Patient also presented with mild headache and neck stiffness that began this morning.  Patient denies any other related symptoms.  Patient denies taking anything for symptoms today.  Given Tylenol in clinic for headache  and neck pain.  Prescribed lidocaine patches as needed for neck pain.  Recommended that he try taking prescribed pain medication for headache.  Discussed return and strict ER precautions. Final Clinical Impressions(s) / UC Diagnoses   Final diagnoses:  Cat scratch  Bad headache  Neck pain     Discharge Instructions      You were given Tylenol in clinic today for your headache. I have prescribed lidocaine patches that you can apply to your neck as needed. Otherwise you can take prescribed hydrocodone as needed for pain.   I recommend applying Neosporin or bacitracin to the scratch on your finger. If you develop finger swelling, increased redness, or pus drainage return here for re-evaluation.   Please contact animal control regarding the stray cat that scratched you. If animal control does not obtain the animal within the next week, I would recommend you come back to seen rabies injections.     ED Prescriptions     Medication Sig Dispense Auth. Provider   lidocaine (LIDODERM) 5 % Place 1 patch onto the skin daily. Remove & Discard patch within 12 hours or as directed by MD 30 patch Letta Kocher, NP      PDMP not reviewed this encounter.   Wynonia Lawman A, NP 02/15/24 [phone]

## 2025-01-16 ENCOUNTER — Encounter: Payer: Self-pay | Admitting: Emergency Medicine

## 2025-01-16 ENCOUNTER — Other Ambulatory Visit: Payer: Self-pay

## 2025-01-16 ENCOUNTER — Ambulatory Visit
Admission: EM | Admit: 2025-01-16 | Discharge: 2025-01-16 | Disposition: A | Discharge status: Home / Self Care | Attending: Nurse Practitioner | Admitting: Nurse Practitioner

## 2025-01-16 DIAGNOSIS — R42 Dizziness and giddiness: Secondary | ICD-10-CM

## 2025-01-16 LAB — POC SOFIA SARS ANTIGEN FIA: SARS Coronavirus 2 Ag: NEGATIVE

## 2025-01-16 NOTE — ED Notes (Signed)
 Patient is being discharged from the Urgent Care and sent to the Emergency Department via private vehicle . Per NP, patient is in need of higher level of care due to dizziness and tachycardia. Patient is aware and verbalizes understanding of plan of care.  Vitals:   01/16/25 1455  BP: (!) 161/92  Pulse: (!) 106  Resp: 18  Temp: 98.1 F (36.7 C)  SpO2: 93%

## 2025-01-16 NOTE — ED Provider Notes (Signed)
 " [facility]    CSN: [id number] Arrival date & time: 01/16/25  1443      History   Chief Complaint No chief complaint on file.   HPI Austin Pena is a 74 y.o. male.   The history is provided by the patient.   Patient presents for several day history of lightheadedness and headache.  Patient states I have a headache, but it does not feel like a headache.  He states that there is nothing that makes his lightheadedness better or worse.  States that it is worse today.  Patient states that the lightheadedness is 5/10 today.  He denies fever, chills, chest pain, shortness of breath, difficulty breathing, abdominal pain, nausea, vomiting, or diarrhea.  He states that he has not been drinking or eating as much.  Per review of the chart, he does have a history of a right bundle branch block.  Patient further denies history of stroke or TIA.  Past Medical History:  Diagnosis Date   BBB (bundle branch block)    RBBB   Finger dysfunction    right ring fingertorn tendon - Dr. Von follows   Hypertension    Neuromuscular disorder [facility])    neuropathic nerve disorder   Prostate cancer [facility])    prostate cancer-diagnosed after biopsy    Patient Active Problem List   Diagnosis Date Noted   Prostate cancer (HCC) 12/12/2016   Malignant neoplasm of prostate (HCC) 03/04/2016    Past Surgical History:  Procedure Laterality Date   BACK SURGERY     COLONOSCOPY     HERNIA REPAIR Bilateral    '13 inguinal left, right -40 yrs ago   LYMPHADENECTOMY Bilateral 12/12/2016   Procedure: PELVIC LYMPHADENECTOMY;  Surgeon: Gretel Ferrara, MD;  Location: [facility];  Service: Urology;  Laterality: Bilateral;   PROSTATE BIOPSY     ROBOT ASSISTED LAPAROSCOPIC RADICAL PROSTATECTOMY N/A 12/12/2016   Procedure: XI ROBOTIC ASSISTED LAPAROSCOPIC RADICAL PROSTATECTOMY LEVEL 2;  Surgeon: Gretel Ferrara, MD;  Location: [facility];  Service: Urology;  Laterality: N/A;   ROTATOR CUFF REPAIR Bilateral     2010-[facility] right, 2014 -left       Home Medications    Prior to Admission medications  Medication Sig Start Date End Date Taking? Authorizing Provider  amitriptyline  (ELAVIL ) 75 MG tablet Take 150 mg by mouth at bedtime. 10/21/16   [provider]  cetirizine  (ZYRTEC  ALLERGY) 10 MG tablet Take 1 tablet (10 mg total) by mouth daily. 12/10/21   Stuart Vernell Norris, PA-C  diclofenac Sodium (VOLTAREN) 1 % GEL Apply as directed 11/28/18   [provider]  docusate sodium  (COLACE) 100 MG capsule Take 1 capsule (100 mg total) by mouth 2 (two) times daily. 12/12/16   Ferrara Gretel, MD  gabapentin  (NEURONTIN ) 300 MG capsule TK 1 C PO BID 06/26/19   [provider]  lidocaine  (LIDODERM ) 5 % Place 1 patch onto the skin daily. Remove & Discard patch within 12 hours or as directed by MD 02/15/24   Johnie Flaming A, NP  lisinopril  (PRINIVIL ,ZESTRIL ) 10 MG tablet Take 10 mg by mouth daily.    [provider]  Turmeric (QC TUMERIC COMPLEX) 500 MG CAPS Take by mouth.    [provider]    Family History Family History  Problem Relation Age of Onset   Cancer Father        reports his father had an enlarged prostate but, prostate ca was never confirmed.    Prostate cancer Brother  Breast cancer Neg Hx    Colon cancer Neg Hx     Social History Social History[1]   Allergies   Aspirin, Ibuprofen, and Peanut oil   Review of Systems Review of Systems Per HPI  Physical Exam Triage Vital Signs ED Triage Vitals  Encounter Vitals Group     BP 01/16/25 1455 (!) 161/92     Girls Systolic BP Percentile --      Girls Diastolic BP Percentile --      Boys Systolic BP Percentile --      Boys Diastolic BP Percentile --      Pulse Rate 01/16/25 1455 (!) 106     Resp 01/16/25 1455 18     Temp 01/16/25 1455 98.1 F (36.7 C)     Temp Source 01/16/25 1455 Oral     SpO2 01/16/25 1455 93 %     Weight --      Height --      Head Circumference --       Peak Flow --      Pain Score 01/16/25 1456 0     Pain Loc --      Pain Education --      Exclude from Growth Chart --    Orthostatic VS for the past 24 hrs:  BP- Lying Pulse- Lying BP- Sitting Pulse- Sitting BP- Standing at 0 minutes Pulse- Standing at 0 minutes  01/16/25 1458 156/89 100 150/90 104 138/87 109    Updated Vital Signs BP (!) 161/92 (BP Location: Right Arm)   Pulse (!) 106   Temp 98.1 F (36.7 C) (Oral)   Resp 18   SpO2 93%   Visual Acuity Right Eye Distance:   Left Eye Distance:   Bilateral Distance:    Right Eye Near:   Left Eye Near:    Bilateral Near:     Physical Exam Vitals and nursing note reviewed.  Constitutional:      General: He is not in acute distress.    Appearance: Normal appearance.  HENT:     Head: Normocephalic.  Eyes:     Extraocular Movements: Extraocular movements intact.     Conjunctiva/sclera: Conjunctivae normal.     Pupils: Pupils are equal, round, and reactive to light.  Cardiovascular:     Rate and Rhythm: Tachycardia present.     Pulses: Normal pulses.     Heart sounds: Normal heart sounds.  Pulmonary:     Effort: Pulmonary effort is normal.     Breath sounds: Normal breath sounds.  Abdominal:     General: Bowel sounds are normal.     Palpations: Abdomen is soft.  Musculoskeletal:     Cervical back: Normal range of motion.  Skin:    General: Skin is warm and dry.  Neurological:     General: No focal deficit present.     Mental Status: He is alert and oriented to person, place, and time.     GCS: GCS eye subscore is 4. GCS verbal subscore is 5. GCS motor subscore is 6.     Cranial Nerves: Cranial nerves 2-12 are intact.     Gait: Gait is intact.  Psychiatric:        Mood and Affect: Mood normal.        Behavior: Behavior normal.      UC Treatments / Results  Labs (all labs ordered are listed, but only abnormal results are displayed) Labs Reviewed  POC SOFIA SARS ANTIGEN FIA    EKG:  Sinus tachycardia,  right bundle branch block, no STEMI.  Compared to EKGs dated 12/08/2016, 01/09/2013 and 01/08/2013.   Radiology No results found.  Procedures Procedures (including critical care time)  Medications Ordered in UC Medications - No data to display  Initial Impression / Assessment and Plan / UC Course  I have reviewed the triage vital signs and the nursing notes.  Pertinent labs & imaging results that were available during my care of the patient were reviewed by me and considered in my medical decision making (see chart for details).  Patient presents for complaints of dizziness and headache for the past several days.  On exam, difficult to obtain a clear history regarding the patient's current symptoms.  He is mildly tachycardic and hypertensive at this time.  EKG was performed which did show sinus tachycardia at a rate of 104, no STEMI.  He also does have a history of a right bundle branch block.  Neurological exam was within normal limits.  No neurological deficits were observed.  COVID test was negative.  Difficult to determine the cause of the patient's symptoms at this time, given the unclear etiology, recommend further follow-up in the emergency department for further evaluation.  Patient was in agreement with this plan of care and verbalizes understanding.  All questions were answered.  Patient stable for discharge.  Patient discharged to the emergency department.   Final Clinical Impressions(s) / UC Diagnoses   Final diagnoses:  None   Discharge Instructions   None    ED Prescriptions   None    PDMP not reviewed this encounter.     [1]  Social History Tobacco Use   Smoking status: Never   Smokeless tobacco: Never  Vaping Use   Vaping status: Never Used  Substance Use Topics   Alcohol use: No   Drug use: No     Gilmer Etta PARAS, NP 01/16/25 1942  "

## 2025-01-16 NOTE — Discharge Instructions (Signed)
 Patient discharged to the emergency department for further evaluation.

## 2025-01-16 NOTE — ED Triage Notes (Signed)
 Lightheaded x 4 days

## 2025-01-17 ENCOUNTER — Emergency Department (HOSPITAL_COMMUNITY)

## 2025-01-17 ENCOUNTER — Other Ambulatory Visit: Payer: Self-pay

## 2025-01-17 ENCOUNTER — Encounter (HOSPITAL_COMMUNITY): Payer: Self-pay | Admitting: *Deleted

## 2025-01-17 ENCOUNTER — Emergency Department (HOSPITAL_COMMUNITY)
Admission: EM | Admit: 2025-01-17 | Discharge: 2025-01-17 | Disposition: A | Discharge status: Home / Self Care | Attending: Emergency Medicine | Admitting: Emergency Medicine

## 2025-01-17 DIAGNOSIS — Z91010 Allergy to peanuts: Secondary | ICD-10-CM | POA: Diagnosis not present

## 2025-01-17 DIAGNOSIS — R42 Dizziness and giddiness: Secondary | ICD-10-CM | POA: Insufficient documentation

## 2025-01-17 DIAGNOSIS — R0981 Nasal congestion: Secondary | ICD-10-CM | POA: Insufficient documentation

## 2025-01-17 DIAGNOSIS — R519 Headache, unspecified: Secondary | ICD-10-CM | POA: Diagnosis not present

## 2025-01-17 DIAGNOSIS — J3489 Other specified disorders of nose and nasal sinuses: Secondary | ICD-10-CM

## 2025-01-17 LAB — CBC WITH DIFFERENTIAL/PLATELET
Abs Immature Granulocytes: 0.01 K/uL (ref 0.00–0.07)
Basophils Absolute: 0 K/uL (ref 0.0–0.1)
Basophils Relative: 1 %
Eosinophils Absolute: 0.2 K/uL (ref 0.0–0.5)
Eosinophils Relative: 4 %
HCT: 42 % (ref 39.0–52.0)
Hemoglobin: 13.8 g/dL (ref 13.0–17.0)
Immature Granulocytes: 0 %
Lymphocytes Relative: 30 %
Lymphs Abs: 1.4 K/uL (ref 0.7–4.0)
MCH: 29.1 pg (ref 26.0–34.0)
MCHC: 32.9 g/dL (ref 30.0–36.0)
MCV: 88.6 fL (ref 80.0–100.0)
Monocytes Absolute: 0.5 K/uL (ref 0.1–1.0)
Monocytes Relative: 10 %
Neutro Abs: 2.5 K/uL (ref 1.7–7.7)
Neutrophils Relative %: 55 %
Platelets: 245 K/uL (ref 150–400)
RBC: 4.74 MIL/uL (ref 4.22–5.81)
RDW: 12.8 % (ref 11.5–15.5)
WBC: 4.5 K/uL (ref 4.0–10.5)
nRBC: 0 % (ref 0.0–0.2)

## 2025-01-17 LAB — COMPREHENSIVE METABOLIC PANEL WITH GFR
ALT: 11 U/L (ref 0–44)
AST: 16 U/L (ref 15–41)
Albumin: 3.9 g/dL (ref 3.5–5.0)
Alkaline Phosphatase: 77 U/L (ref 38–126)
Anion gap: 9 (ref 5–15)
BUN: 13 mg/dL (ref 8–23)
CO2: 27 mmol/L (ref 22–32)
Calcium: 9.1 mg/dL (ref 8.9–10.3)
Chloride: 102 mmol/L (ref 98–111)
Creatinine, Ser: 1.06 mg/dL (ref 0.61–1.24)
GFR, Estimated: >60 mL/min
Glucose, Bld: 88 mg/dL (ref 70–99)
Potassium: 4 mmol/L (ref 3.5–5.1)
Sodium: 138 mmol/L (ref 135–145)
Total Bilirubin: 0.3 mg/dL (ref 0.0–1.2)
Total Protein: 6.6 g/dL (ref 6.5–8.1)

## 2025-01-17 LAB — URINALYSIS, ROUTINE W REFLEX MICROSCOPIC
Bilirubin Urine: NEGATIVE
Glucose, UA: NEGATIVE mg/dL
Hgb urine dipstick: NEGATIVE
Ketones, ur: NEGATIVE mg/dL
Leukocytes,Ua: NEGATIVE
Nitrite: NEGATIVE
Protein, ur: NEGATIVE mg/dL
Specific Gravity, Urine: 1.011 (ref 1.005–1.030)
pH: 6 (ref 5.0–8.0)

## 2025-01-17 LAB — MAGNESIUM: Magnesium: 2.1 mg/dL (ref 1.7–2.4)

## 2025-01-17 LAB — TROPONIN T, HIGH SENSITIVITY: Troponin T High Sensitivity: 10 ng/L (ref 0–19)

## 2025-01-17 MED ORDER — SODIUM CHLORIDE 0.9 % IV BOLUS
500.0000 mL | Freq: Once | INTRAVENOUS | Status: AC
  Administered 2025-01-17: 500 mL via INTRAVENOUS

## 2025-01-17 MED ORDER — IOHEXOL 350 MG/ML SOLN
75.0000 mL | Freq: Once | INTRAVENOUS | Status: AC | PRN
  Administered 2025-01-17: 75 mL via INTRAVENOUS

## 2025-01-17 MED ORDER — FLUTICASONE PROPIONATE 50 MCG/ACT NA SUSP: 2.0000 | Freq: Every day | NASAL | 0 refills | Status: AC

## 2025-01-17 NOTE — ED Notes (Signed)
 Pt gone to CT

## 2025-01-17 NOTE — ED Triage Notes (Signed)
 Pt c/o feeling lightheaded x one week and states he thinks his BP has been elevated

## 2025-01-17 NOTE — Discharge Instructions (Signed)
 Please follow-up closely with your primary care doctor on an outpatient basis for a recheck.  Return to the emergency department immediately for any new or worsening symptoms.  Please start using the nasal spray.

## 2025-01-17 NOTE — ED Notes (Signed)
 Pt was already d/c but stopped at the front desk to ask about his blood sugar. This nurse took his BG to ensure the pt that his BG level was normal. BG is 70.   Pt had not further questions, comments or concerns after his BG was checked.

## 2025-01-17 NOTE — ED Provider Notes (Signed)
 " [place] [facility] AT [facility] Provider Note   CSN: [id number] Arrival date & time: 01/17/25  1003     Patient presents with: No chief complaint on file.   Austin Pena is a 74 y.o. male.   Patient is a 74 year old male who presents to Emergency Department with a chief complaint of lightheadedness and dizziness with standing which has been ongoing for approximate the past week.  He has had some associated nasal congestion as well.  He notes that he has had no chest pain, shortness of breath, palpitations.  He has had no abdominal pain, nausea, vomiting, diarrhea.  He notes he has continued to eat and drink at his baseline.  He denies any numbness, paresthesias or unilateral weakness.  He does admit to a frontal headache.        Prior to Admission medications  Medication Sig Start Date End Date Taking? Authorizing Provider  amitriptyline  (ELAVIL ) 75 MG tablet Take 150 mg by mouth at bedtime. 10/21/16   [provider]  cetirizine  (ZYRTEC  ALLERGY) 10 MG tablet Take 1 tablet (10 mg total) by mouth daily. 12/10/21   Stuart Vernell Norris, PA-C  diclofenac Sodium (VOLTAREN) 1 % GEL Apply as directed 11/28/18   [provider]  docusate sodium  (COLACE) 100 MG capsule Take 1 capsule (100 mg total) by mouth 2 (two) times daily. 12/12/16   Renda Glance, MD  gabapentin  (NEURONTIN ) 300 MG capsule TK 1 C PO BID 06/26/19   [provider]  lidocaine  (LIDODERM ) 5 % Place 1 patch onto the skin daily. Remove & Discard patch within 12 hours or as directed by MD 02/15/24   Johnie Flaming A, NP  lisinopril  (PRINIVIL ,ZESTRIL ) 10 MG tablet Take 10 mg by mouth daily.    [provider]  Turmeric (QC TUMERIC COMPLEX) 500 MG CAPS Take by mouth.    [provider]    Allergies: Aspirin, Ibuprofen, and Peanut oil    Review of Systems  Neurological:  Positive for dizziness.  All other systems reviewed and are negative.   Updated  Vital Signs BP (!) 150/87 (BP Location: Right Arm)   Pulse 93   Temp 98.2 F (36.8 C) (Oral)   Resp 17   Ht 6' (1.829 m)   Wt 89.8 kg   SpO2 100%   BMI 26.85 kg/m   Physical Exam Vitals and nursing note reviewed.  Constitutional:      General: He is not in acute distress.    Appearance: Normal appearance. He is not ill-appearing.  HENT:     Head: Normocephalic and atraumatic.     Nose: Nose normal.     Mouth/Throat:     Mouth: Mucous membranes are moist.  Eyes:     Extraocular Movements: Extraocular movements intact.     Conjunctiva/sclera: Conjunctivae normal.     Pupils: Pupils are equal, round, and reactive to light.  Cardiovascular:     Rate and Rhythm: Normal rate and regular rhythm.     Pulses: Normal pulses.     Heart sounds: Normal heart sounds. No murmur heard.    No gallop.  Pulmonary:     Effort: Pulmonary effort is normal. No respiratory distress.     Breath sounds: Normal breath sounds. No stridor. No wheezing, rhonchi or rales.  Abdominal:     General: Abdomen is flat. Bowel sounds are normal. There is no distension.     Palpations: Abdomen is soft.     Tenderness: There is  no abdominal tenderness. There is no guarding.  Musculoskeletal:        General: Normal range of motion.     Cervical back: Normal range of motion and neck supple. No rigidity or tenderness.     Right lower leg: No edema.     Left lower leg: No edema.  Skin:    General: Skin is warm and dry.     Findings: No bruising or rash.  Neurological:     General: No focal deficit present.     Mental Status: He is alert and oriented to person, place, and time. Mental status is at baseline.     Cranial Nerves: No cranial nerve deficit.     Sensory: No sensory deficit.     Motor: No weakness.     Coordination: Coordination normal.     Gait: Gait normal.  Psychiatric:        Mood and Affect: Mood normal.        Behavior: Behavior normal.        Thought Content: Thought content normal.         Judgment: Judgment normal.     (all labs ordered are listed, but only abnormal results are displayed) Labs Reviewed  URINALYSIS, ROUTINE W REFLEX MICROSCOPIC - Abnormal; Notable for the following components:      Result Value   Color, Urine STRAW (*)    All other components within normal limits  CBC WITH DIFFERENTIAL/PLATELET  CBC WITH DIFFERENTIAL/PLATELET  COMPREHENSIVE METABOLIC PANEL WITH GFR  MAGNESIUM   TROPONIN T, HIGH SENSITIVITY    EKG: None  Radiology: No results found.   Procedures   Medications Ordered in the ED  sodium chloride  0.9 % bolus 500 mL (500 mLs Intravenous New Bag/Given 01/17/25 1140)                                    Medical Decision Making Patient is doing very well at this time and is stable for discharge home.  Discussed with patient that all workup in the emergency department has been unremarkable.  Do suspect that his symptoms do have a component of dehydration as he has had decreased p.o. intake over the past few days as well as sinus congestion.  He notes that the pain is mainly located along the frontal sinuses and does admit to a sensation of congestion in his nose.  CTA of the head and neck demonstrated no signs of large vessel occlusion or aneurysm.  MRI of the brain was unremarkable for any signs of acute CVA.  He has no focal neurodeficits at this point.  Symptoms have improved with IV fluids in the emergency department.  Orthostatics were overall unremarkable.  Did discuss the need for increased p.o. fluid intake at home and will place on a steroid nasal spray.  Discussed the importance of close follow-up with his primary care doctor on an outpatient basis for continued management.  Strict return precautions were provided as well for any new or worsening symptoms.  Do not suspect that admission is warranted at this time and patient is eager for discharge home.  Patient voiced understanding to the plan and had no additional questions.  Amount  and/or Complexity of Data Reviewed Labs: ordered. Radiology: ordered.  Risk Prescription drug management.        Final diagnoses:  None    ED Discharge Orders     None  Daralene Lonni BIRCH, PA-C 01/17/25 1727    Suzette Pac, MD 01/18/25 2039  "

## 2025-01-20 LAB — CBG MONITORING, ED: Glucose-Capillary: 70 mg/dL (ref 70–99)
